# Patient Record
Sex: Female | Born: 1955 | Race: White | Hispanic: No | Marital: Married | Smoking: Never smoker
Health system: Southern US, Community
[De-identification: ages and names within clinical notes are randomized; demographics above are authoritative.]

## PROBLEM LIST (undated history)

## (undated) DIAGNOSIS — C801 Malignant (primary) neoplasm, unspecified: Secondary | ICD-10-CM

## (undated) DIAGNOSIS — G609 Hereditary and idiopathic neuropathy, unspecified: Secondary | ICD-10-CM

## (undated) DIAGNOSIS — K219 Gastro-esophageal reflux disease without esophagitis: Secondary | ICD-10-CM

## (undated) DIAGNOSIS — E079 Disorder of thyroid, unspecified: Secondary | ICD-10-CM

## (undated) HISTORY — DX: Gastro-esophageal reflux disease without esophagitis: K21.9

## (undated) HISTORY — PX: OTHER SURGICAL HISTORY: SHX169

## (undated) HISTORY — DX: Disorder of thyroid, unspecified: E07.9

## (undated) HISTORY — PX: LASIK: SHX215

## (undated) HISTORY — DX: Hereditary and idiopathic neuropathy, unspecified: G60.9

## (undated) HISTORY — PX: CHOLECYSTECTOMY: SHX55

## (undated) HISTORY — PX: HERNIA REPAIR: SHX51

## (undated) HISTORY — PX: ELBOW SURGERY: SHX618

## (undated) HISTORY — PX: ABDOMINAL HYSTERECTOMY: SHX81

## (undated) HISTORY — PX: PLANTAR FASCIA SURGERY: SHX746

---

## 2005-05-10 ENCOUNTER — Other Ambulatory Visit: Payer: Self-pay

## 2005-05-10 ENCOUNTER — Inpatient Hospital Stay: Payer: Self-pay | Admitting: Internal Medicine

## 2005-07-11 ENCOUNTER — Ambulatory Visit: Payer: Self-pay

## 2005-07-13 ENCOUNTER — Ambulatory Visit: Payer: Self-pay

## 2006-01-23 ENCOUNTER — Ambulatory Visit: Payer: Self-pay

## 2006-05-07 ENCOUNTER — Ambulatory Visit: Payer: Self-pay | Admitting: Gastroenterology

## 2006-07-16 ENCOUNTER — Ambulatory Visit: Payer: Self-pay

## 2007-03-12 ENCOUNTER — Encounter: Payer: Self-pay | Admitting: General Practice

## 2007-03-31 ENCOUNTER — Encounter: Payer: Self-pay | Admitting: General Practice

## 2007-04-30 ENCOUNTER — Encounter: Payer: Self-pay | Admitting: General Practice

## 2007-07-08 ENCOUNTER — Ambulatory Visit: Payer: Self-pay | Admitting: Family Medicine

## 2007-07-17 ENCOUNTER — Ambulatory Visit: Payer: Self-pay | Admitting: Obstetrics and Gynecology

## 2007-10-26 ENCOUNTER — Ambulatory Visit: Payer: Self-pay | Admitting: Family Medicine

## 2008-07-05 ENCOUNTER — Ambulatory Visit: Payer: Self-pay | Admitting: General Surgery

## 2008-07-08 ENCOUNTER — Ambulatory Visit: Payer: Self-pay | Admitting: General Surgery

## 2008-07-20 ENCOUNTER — Ambulatory Visit: Payer: Self-pay | Admitting: General Surgery

## 2008-07-20 ENCOUNTER — Ambulatory Visit: Payer: Self-pay | Admitting: Obstetrics and Gynecology

## 2008-07-28 ENCOUNTER — Ambulatory Visit: Payer: Self-pay | Admitting: General Surgery

## 2008-09-29 ENCOUNTER — Ambulatory Visit: Payer: Self-pay | Admitting: Podiatry

## 2009-03-24 ENCOUNTER — Ambulatory Visit: Payer: Self-pay | Admitting: General Practice

## 2009-05-16 ENCOUNTER — Ambulatory Visit: Payer: Self-pay | Admitting: Neurology

## 2009-07-18 ENCOUNTER — Encounter: Admission: RE | Admit: 2009-07-18 | Discharge: 2009-07-18 | Payer: Self-pay | Admitting: Neurosurgery

## 2009-07-27 ENCOUNTER — Ambulatory Visit: Payer: Self-pay | Admitting: Unknown Physician Specialty

## 2009-10-27 ENCOUNTER — Encounter: Admission: RE | Admit: 2009-10-27 | Discharge: 2009-10-27 | Payer: Self-pay | Admitting: Neurosurgery

## 2010-08-09 ENCOUNTER — Ambulatory Visit: Payer: Self-pay | Admitting: Unknown Physician Specialty

## 2010-08-20 ENCOUNTER — Encounter: Payer: Self-pay | Admitting: Neurosurgery

## 2011-02-05 ENCOUNTER — Ambulatory Visit: Payer: Self-pay | Admitting: General Surgery

## 2011-08-30 ENCOUNTER — Ambulatory Visit: Payer: Self-pay | Admitting: Obstetrics and Gynecology

## 2012-09-02 ENCOUNTER — Ambulatory Visit: Payer: Self-pay | Admitting: Family Medicine

## 2012-11-07 ENCOUNTER — Ambulatory Visit: Payer: Self-pay | Admitting: Unknown Physician Specialty

## 2013-12-03 DIAGNOSIS — E039 Hypothyroidism, unspecified: Secondary | ICD-10-CM | POA: Insufficient documentation

## 2013-12-03 DIAGNOSIS — E559 Vitamin D deficiency, unspecified: Secondary | ICD-10-CM | POA: Insufficient documentation

## 2014-03-04 DIAGNOSIS — E781 Pure hyperglyceridemia: Secondary | ICD-10-CM | POA: Insufficient documentation

## 2014-03-25 ENCOUNTER — Ambulatory Visit: Payer: Self-pay | Admitting: Obstetrics and Gynecology

## 2014-04-29 ENCOUNTER — Ambulatory Visit: Payer: Self-pay | Admitting: Unknown Physician Specialty

## 2014-05-12 ENCOUNTER — Ambulatory Visit: Payer: Self-pay | Admitting: Unknown Physician Specialty

## 2014-05-17 LAB — PATHOLOGY REPORT

## 2014-06-16 ENCOUNTER — Encounter: Payer: Self-pay | Admitting: Unknown Physician Specialty

## 2014-06-29 ENCOUNTER — Encounter: Payer: Self-pay | Admitting: Unknown Physician Specialty

## 2014-11-20 NOTE — Op Note (Signed)
PATIENT NAME:  Rachel Noble, HEMING MR#:  [id number] DATE OF BIRTH:  1956/03/15  DATE OF PROCEDURE:  05/12/2014  PREOPERATIVE DIAGNOSIS: Chronic lateral epicondylitis both elbows, right worse than left.   OPERATION: Nirschl procedure right elbow plus injection of left lateral epicondyle with steroid anesthetic.   SURGEON: Kathrene Alu., MD   ANESTHESIA: General.   HISTORY OF PRESENT ILLNESS: The patient had a long history of bilateral lateral epicondylitis of her elbows. I had injected both lateral epicondyles with steroid and anesthetic in the past. She, however, was still having some moderate residual discomfort referable to her right elbow.   The patient was brought in today for a Nirschl procedure on her right elbow and re- injection of her left lateral epicondyle with steroid and anesthetic.   DESCRIPTION OF PROCEDURE: The patient was taken to the Operating Room where satisfactory general anesthesia was achieved. A tourniquet was applied on her right upper arm and the right upper extremity was prepped and draped in the usual fashion for a procedure about the elbow. The right upper extremity was exsanguinated and the tourniquet was inflated. About an inch and a half longitudinal incision was made over the lateral epicondyle. The dissection was carried down through the subcutaneous tissue onto the common extensor attachment to the lateral epicondyle. The interval between the extensor  carpi radialis longus and the common extensor was identified. It was opened and then I dissected down to the lateral epicondyle itself and the insertion of the extensor carpi radialis brevis onto the lateral epicondyle. I went ahead and excised the scarred extensor carpi radialis brevis attachment in a V-shaped fashion. The radiohumeral joint was entered. There was a moderate amount of synovial fluid emanating from this joint.   I inspected the joint. No lesions were noted. I irrigated it with GU irrigant. I  then used a rongeur to debride the lateral epicondyle, and this was followed by the use of a small round bur to lightly decorticate the lateral epicondyle. I then drilled multiple small drill holes in the lateral epicondyle. I then closed the interval between the extensor carpi radialis longus and the common extensor with 2-0 Vicryl. The tourniquet was released. It was up 27 minutes. There was minimal bleeding. I went ahead and closed the subcutaneous with 3-0 Vicryl and the skin with skin staples. I injected the wound with about 15 mL of 0.5% Marcaine with epinephrine. Sterile dressing was applied followed by a fiberglass posterior splint with the elbow flexed 90 degrees. Wrist was incorporated into the posterior splint.   I then prepped the left elbow lateral epicondyle and injected it with several milliliters of a mixture of 0.5% Marcaine with epinephrine and Kenalog 40. Band-Aid was applied.   The patient was awakened and transferred to a stretcher bed. She was taken to the recovery room in satisfactory condition. Blood loss was negligible.    ____________________________ Kathrene Alu., MD hbk:TT D: 05/12/2014 37:62:83 ET T: 05/12/2014 17:09:51 ET JOB#: [id number]  cc: Kathrene Alu., MD, <Dictator> Vilinda Flake, Brooke Bonito MD ELECTRONICALLY SIGNED 05/23/2014 15:21

## 2015-04-21 ENCOUNTER — Other Ambulatory Visit: Payer: Self-pay | Admitting: Obstetrics and Gynecology

## 2015-04-21 DIAGNOSIS — Z1231 Encounter for screening mammogram for malignant neoplasm of breast: Secondary | ICD-10-CM

## 2015-04-28 ENCOUNTER — Ambulatory Visit
Admission: RE | Admit: 2015-04-28 | Discharge: 2015-04-28 | Disposition: A | Discharge status: Home / Self Care | Payer: 59 | Source: Ambulatory Visit | Attending: Obstetrics and Gynecology | Admitting: Obstetrics and Gynecology

## 2015-04-28 DIAGNOSIS — Z1231 Encounter for screening mammogram for malignant neoplasm of breast: Secondary | ICD-10-CM | POA: Insufficient documentation

## 2015-04-28 HISTORY — DX: Malignant (primary) neoplasm, unspecified: C80.1

## 2015-07-18 ENCOUNTER — Other Ambulatory Visit: Payer: Self-pay | Admitting: Emergency Medicine

## 2015-07-18 DIAGNOSIS — K219 Gastro-esophageal reflux disease without esophagitis: Secondary | ICD-10-CM

## 2015-07-18 MED ORDER — OMEPRAZOLE 40 MG PO CPDR: 40.0000 mg | DELAYED_RELEASE_CAPSULE | Freq: Once | ORAL | Status: DC

## 2015-07-18 NOTE — Telephone Encounter (Signed)
Received a faxed medication request from [facility] in [place].  Please advise.  Thank you.

## 2015-07-18 NOTE — Telephone Encounter (Signed)
Med refill approved 

## 2015-07-21 ENCOUNTER — Ambulatory Visit: Payer: Self-pay | Admitting: Physician Assistant

## 2015-10-14 ENCOUNTER — Other Ambulatory Visit: Payer: Self-pay

## 2015-10-14 DIAGNOSIS — Z299 Encounter for prophylactic measures, unspecified: Secondary | ICD-10-CM

## 2015-10-14 NOTE — Progress Notes (Signed)
Patient came in to have blood drawn per Dr. Sammuel Hines orders.  Blood was drawn from the right arm without any incident. Lab results are to sent to Dr. Eddie Dibbles with they are finalized.

## 2015-10-15 LAB — LIPID PANEL
CHOLESTEROL TOTAL: 174 mg/dL (ref 100–199)
Chol/HDL Ratio: 3.6 ratio units (ref 0.0–4.4)
HDL: 49 mg/dL (ref >39)
LDL Calculated: 84 mg/dL (ref 0–99)
TRIGLYCERIDES: 206 mg/dL — AB (ref 0–149)
VLDL CHOLESTEROL CAL: 41 mg/dL — AB (ref 5–40)

## 2015-10-15 LAB — HGB A1C W/O EAG: Hgb A1c MFr Bld: 5.5 % (ref 4.8–5.6)

## 2015-10-15 LAB — VITAMIN B12: Vitamin B-12: 912 pg/mL (ref 211–946)

## 2015-10-15 LAB — VITAMIN D 25 HYDROXY (VIT D DEFICIENCY, FRACTURES): VIT D 25 HYDROXY: 26.9 ng/mL — AB (ref 30.0–100.0)

## 2015-10-15 LAB — TSH: TSH: 3.01 u[IU]/mL (ref 0.450–4.500)

## 2015-10-19 NOTE — Progress Notes (Signed)
Lab results were faxed to Dr. Blossom Hoops at [facility] per patient's request.

## 2015-11-16 ENCOUNTER — Other Ambulatory Visit: Payer: Self-pay | Admitting: Emergency Medicine

## 2015-11-16 MED ORDER — OMEPRAZOLE 40 MG PO CPDR: 40.0000 mg | DELAYED_RELEASE_CAPSULE | Freq: Once | ORAL | Status: DC

## 2015-11-16 NOTE — Telephone Encounter (Signed)
Refill x 1 only, pt has not been seen in clinic since prior to July 2016, will not issue another refill until pt has been seen in clinic, or her pcp can refill meds if needed

## 2015-11-16 NOTE — Telephone Encounter (Signed)
Received a faxed medication request from [facility].  Please advise.  Thank you.

## 2015-12-16 ENCOUNTER — Telehealth: Payer: Self-pay | Admitting: Physician Assistant

## 2015-12-16 NOTE — Telephone Encounter (Signed)
Pt needs appointment, hasn't been seen in the clinic in one year, or she can have her pcp refill this medication

## 2016-01-16 ENCOUNTER — Other Ambulatory Visit: Payer: Self-pay | Admitting: Emergency Medicine

## 2016-02-15 ENCOUNTER — Other Ambulatory Visit: Payer: Self-pay | Admitting: Emergency Medicine

## 2016-02-15 MED ORDER — OMEPRAZOLE 40 MG PO CPDR: 40.0000 mg | DELAYED_RELEASE_CAPSULE | Freq: Once | ORAL | Status: DC

## 2016-03-14 ENCOUNTER — Ambulatory Visit: Payer: Self-pay | Admitting: Physician Assistant

## 2016-03-14 ENCOUNTER — Encounter: Payer: Self-pay | Admitting: Physician Assistant

## 2016-03-14 VITALS — BP 120/80 | HR 85 | Temp 98.3°F

## 2016-03-14 DIAGNOSIS — J069 Acute upper respiratory infection, unspecified: Secondary | ICD-10-CM

## 2016-03-14 DIAGNOSIS — J018 Other acute sinusitis: Secondary | ICD-10-CM

## 2016-03-14 MED ORDER — AMOXICILLIN 875 MG PO TABS: 875.0000 mg | ORAL_TABLET | Freq: Two times a day (BID) | ORAL | 0 refills | Status: DC

## 2016-03-14 MED ORDER — BENZONATATE 200 MG PO CAPS: 200.0000 mg | ORAL_CAPSULE | Freq: Three times a day (TID) | ORAL | 0 refills | Status: DC | PRN

## 2016-03-14 NOTE — Progress Notes (Signed)
S: C/o runny nose and congestion for 5-6 days, had fever last night,  denies cp/sob, v/d;  cough is sporadic, c/o of facial and dental pain. Used her husbands tussionex for cough and had a lot of itching  Using otc meds: zyrtec  O: PE: perrl eomi, normocephalic, tms dull, nasal mucosa red and swollen, throat injected, neck supple no lymph, lungs c t a, cv rrr, neuro intact  A:  Acute sinusitis   P: drink fluids, continue regular meds , use otc meds of choice, return if not improving in 5 days, return earlier if worsening , amoxil, tessalon, do not use tussionex due to  allergic reaction

## 2016-03-19 ENCOUNTER — Encounter: Payer: Self-pay | Admitting: Physician Assistant

## 2016-03-19 ENCOUNTER — Ambulatory Visit: Payer: Self-pay | Admitting: Physician Assistant

## 2016-03-19 VITALS — BP 151/81 | HR 101 | Temp 97.9°F

## 2016-03-19 DIAGNOSIS — J069 Acute upper respiratory infection, unspecified: Secondary | ICD-10-CM

## 2016-03-19 MED ORDER — METHYLPREDNISOLONE 4 MG PO TBPK: ORAL_TABLET | ORAL | 0 refills | Status: DC

## 2016-03-19 NOTE — Progress Notes (Signed)
S: continues to feel bad, cough isn't as bad, not sure if its because of the tessalon perls, states has not had a fever, is not getting any mucus out, feels like its her throat and sinuses, is taking amoxil but feels more tired than she did last week  O: vitals wnl, nad, ENT wnl, neck supple no lymph, lungs c t a, cv rrr, voice is hoarse  A: acute uri  P: medrol dose pack, allegra, continue amoxil, if not better in 2-3 days will switch antibiotic

## 2016-05-11 ENCOUNTER — Other Ambulatory Visit: Payer: Self-pay

## 2016-05-11 DIAGNOSIS — E039 Hypothyroidism, unspecified: Secondary | ICD-10-CM

## 2016-05-11 NOTE — Progress Notes (Signed)
Patient in office today for labwk only. Order on file from [facility] Dr. Lillia Pauls

## 2016-05-12 LAB — SPECIMEN STATUS

## 2016-05-15 LAB — TSH: TSH: 2.38 u[IU]/mL (ref 0.450–4.500)

## 2016-05-15 LAB — THYROID PEROXIDASE ANTIBODY: THYROID PEROXIDASE ANTIBODY: 19 [IU]/mL (ref 0–34)

## 2016-07-06 ENCOUNTER — Other Ambulatory Visit: Payer: Self-pay

## 2016-07-06 DIAGNOSIS — Z299 Encounter for prophylactic measures, unspecified: Secondary | ICD-10-CM

## 2016-07-06 NOTE — Progress Notes (Signed)
Patient came in to have blood drawn for testing per Dr. Adella Hare and Glenis Smoker CNM orders.

## 2016-07-07 LAB — CMP12+LP+TP+TSH+6AC+CBC/D/PLT
A/G RATIO: 1.8 (ref 1.2–2.2)
ALT: 11 IU/L (ref 0–32)
AST: 12 IU/L (ref 0–40)
Albumin: 4.5 g/dL (ref 3.5–5.5)
Alkaline Phosphatase: 102 IU/L (ref 39–117)
BASOS: 1 %
BUN / CREAT RATIO: 15 (ref 9–23)
BUN: 11 mg/dL (ref 6–24)
Basophils Absolute: 0.1 10*3/uL (ref 0.0–0.2)
Bilirubin Total: 0.3 mg/dL (ref 0.0–1.2)
CALCIUM: 9.6 mg/dL (ref 8.7–10.2)
CHOL/HDL RATIO: 3.8 ratio (ref 0.0–4.4)
CREATININE: 0.72 mg/dL (ref 0.57–1.00)
Chloride: 99 mmol/L (ref 96–106)
Cholesterol, Total: 202 mg/dL — ABNORMAL HIGH (ref 100–199)
EOS (ABSOLUTE): 0.1 10*3/uL (ref 0.0–0.4)
EOS: 1 %
Estimated CHD Risk: 0.8 times avg. (ref 0.0–1.0)
Free Thyroxine Index: 0.9 — ABNORMAL LOW (ref 1.2–4.9)
GFR, EST AFRICAN AMERICAN: 107 mL/min/{1.73_m2} (ref >59)
GFR, EST NON AFRICAN AMERICAN: 93 mL/min/{1.73_m2} (ref >59)
GGT: 14 IU/L (ref 0–60)
GLUCOSE: 93 mg/dL (ref 65–99)
Globulin, Total: 2.5 g/dL (ref 1.5–4.5)
HDL: 53 mg/dL (ref >39)
HEMATOCRIT: 41.6 % (ref 34.0–46.6)
HEMOGLOBIN: 13.8 g/dL (ref 11.1–15.9)
IMMATURE GRANS (ABS): 0 10*3/uL (ref 0.0–0.1)
Immature Granulocytes: 0 %
Iron: 118 ug/dL (ref 27–159)
LDH: 137 IU/L (ref 119–226)
LDL CALC: 99 mg/dL (ref 0–99)
Lymphocytes Absolute: 4.1 10*3/uL — ABNORMAL HIGH (ref 0.7–3.1)
Lymphs: 41 %
MCH: 29.1 pg (ref 26.6–33.0)
MCHC: 33.2 g/dL (ref 31.5–35.7)
MCV: 88 fL (ref 79–97)
MONOCYTES: 5 %
Monocytes Absolute: 0.5 10*3/uL (ref 0.1–0.9)
NEUTROS ABS: 5.3 10*3/uL (ref 1.4–7.0)
Neutrophils: 52 %
PHOSPHORUS: 3.1 mg/dL (ref 2.5–4.5)
POTASSIUM: 4.5 mmol/L (ref 3.5–5.2)
Platelets: 369 10*3/uL (ref 150–379)
RBC: 4.74 x10E6/uL (ref 3.77–5.28)
RDW: 13.1 % (ref 12.3–15.4)
SODIUM: 141 mmol/L (ref 134–144)
T3 Uptake Ratio: 15 % — ABNORMAL LOW (ref 24–39)
T4 TOTAL: 6.2 ug/dL (ref 4.5–12.0)
TSH: 3.27 u[IU]/mL (ref 0.450–4.500)
Total Protein: 7 g/dL (ref 6.0–8.5)
Triglycerides: 250 mg/dL — ABNORMAL HIGH (ref 0–149)
URIC ACID: 5 mg/dL (ref 2.5–7.1)
VLDL Cholesterol Cal: 50 mg/dL — ABNORMAL HIGH (ref 5–40)
WBC: 10.1 10*3/uL (ref 3.4–10.8)

## 2016-07-07 LAB — HGB A1C W/O EAG: Hgb A1c MFr Bld: 5.4 % (ref 4.8–5.6)

## 2016-07-07 LAB — B12 AND FOLATE PANEL
Folate: 4.8 ng/mL (ref >3.0)
VITAMIN B 12: 861 pg/mL (ref 232–1245)

## 2016-07-07 LAB — VITAMIN D 25 HYDROXY (VIT D DEFICIENCY, FRACTURES): Vit D, 25-Hydroxy: 21.6 ng/mL — ABNORMAL LOW (ref 30.0–100.0)

## 2016-07-07 LAB — T4, FREE: FREE T4: 0.74 ng/dL — AB (ref 0.82–1.77)

## 2016-08-14 ENCOUNTER — Other Ambulatory Visit: Payer: Self-pay | Admitting: Obstetrics and Gynecology

## 2016-08-14 DIAGNOSIS — Z1231 Encounter for screening mammogram for malignant neoplasm of breast: Secondary | ICD-10-CM

## 2016-08-20 ENCOUNTER — Ambulatory Visit: Payer: Self-pay | Admitting: Physician Assistant

## 2016-08-20 VITALS — BP 125/79 | HR 90 | Temp 98.5°F

## 2016-08-20 DIAGNOSIS — J069 Acute upper respiratory infection, unspecified: Secondary | ICD-10-CM

## 2016-08-20 DIAGNOSIS — J09X2 Influenza due to identified novel influenza A virus with other respiratory manifestations: Secondary | ICD-10-CM

## 2016-08-20 LAB — POCT INFLUENZA A/B
INFLUENZA B, POC: NEGATIVE
Influenza A, POC: POSITIVE — AB

## 2016-08-20 NOTE — Progress Notes (Signed)
See note scanned per NP .

## 2016-08-21 LAB — POCT RAPID STREP A (OFFICE): Rapid Strep A Screen: NEGATIVE

## 2016-08-22 ENCOUNTER — Ambulatory Visit: Payer: Self-pay | Admitting: Physician Assistant

## 2016-08-22 ENCOUNTER — Encounter: Payer: Self-pay | Admitting: Physician Assistant

## 2016-08-22 VITALS — BP 140/80 | HR 110 | Temp 98.5°F

## 2016-08-22 DIAGNOSIS — J101 Influenza due to other identified influenza virus with other respiratory manifestations: Secondary | ICD-10-CM

## 2016-08-22 DIAGNOSIS — I10 Essential (primary) hypertension: Secondary | ICD-10-CM | POA: Insufficient documentation

## 2016-08-22 NOTE — Progress Notes (Signed)
S: here for recheck, was dx with influenza A on Monday, given tamiflu, did get a flu vaccine this year, states she hasn't had temp or body aches, cough is controlled with nyquil and is taking the tamiflu, ?if her husband should be on preventive medication, denies cp/sob/v/d  O: vitals wnl, nad, ent wnl, neck supple no lymph, lungs c ta , cv rrr  A: influenza A recheck  P: continue tamflu, sent rx for her husband Rachel Noble (also a pt here) for tamiflu qd x 10d; return prn

## 2016-09-13 ENCOUNTER — Ambulatory Visit
Admission: RE | Admit: 2016-09-13 | Discharge: 2016-09-13 | Disposition: A | Discharge status: Home / Self Care | Payer: Managed Care, Other (non HMO) | Source: Ambulatory Visit | Attending: Obstetrics and Gynecology | Admitting: Obstetrics and Gynecology

## 2016-09-13 DIAGNOSIS — Z1231 Encounter for screening mammogram for malignant neoplasm of breast: Secondary | ICD-10-CM | POA: Insufficient documentation

## 2016-09-18 ENCOUNTER — Ambulatory Visit (INDEPENDENT_AMBULATORY_CARE_PROVIDER_SITE_OTHER): Payer: Managed Care, Other (non HMO) | Admitting: Family Medicine

## 2016-09-18 ENCOUNTER — Encounter: Payer: Self-pay | Admitting: Family Medicine

## 2016-09-18 VITALS — BP 128/74 | HR 64 | Temp 97.9°F | Resp 16 | Ht 64.0 in | Wt 170.0 lb

## 2016-09-18 DIAGNOSIS — E039 Hypothyroidism, unspecified: Secondary | ICD-10-CM | POA: Diagnosis not present

## 2016-09-18 DIAGNOSIS — E559 Vitamin D deficiency, unspecified: Secondary | ICD-10-CM | POA: Diagnosis not present

## 2016-09-18 NOTE — Progress Notes (Signed)
Patient: Rachel Noble Female    DOB: May 25, 1956   61 y.o.   MRN: [id number] Visit Date: 09/18/2016  Today's Provider: Wilhemena Durie, MD   Chief Complaint  Patient presents with  . Establish Care   Subjective:    HPI   Establish Care Pt is here to establish care. Pt's previous PCP retired. Pt sees Dr. Jonnie Kind for [facility], and sees dr. Eddie Dibbles for endocrinology. Pt feels well today with no complaints.  she is married (second marriage), now 41 years. She has 2 children and 2 grandchildren. She was previously seeing Dr. Gaylan Gerold who retired.  Allergies  Allergen Reactions  . Tussionex Pennkinetic Er [Hydrocod Polst-Cpm Polst Er] Itching     Current Outpatient Prescriptions:  .  cetirizine (ZYRTEC) 10 MG tablet, Take 10 mg by mouth daily., Disp: , Rfl:  .  Cholecalciferol (VITAMIN D-1000 MAX ST) 1000 UNITS tablet, Take by mouth., Disp: , Rfl:  .  cyanocobalamin (,VITAMIN B-12,) 1000 MCG/ML injection, Inject into the muscle., Disp: , Rfl:  .  DULoxetine (CYMBALTA) 20 MG capsule, Take by mouth., Disp: , Rfl:  .  estradiol (ESTRACE) 1 MG tablet, Take by mouth., Disp: , Rfl:  .  Krill Oil 1000 MG CAPS, Take by mouth., Disp: , Rfl:  .  omeprazole (PRILOSEC) 40 MG capsule, Take 1 capsule (40 mg total) by mouth once., Disp: 30 capsule, Rfl: 5 .  thyroid (ARMOUR THYROID) 15 MG tablet, Take by mouth., Disp: , Rfl:   Review of Systems  Constitutional: Negative.   HENT: Negative.   Eyes: Negative.   Respiratory: Negative.   Cardiovascular: Negative.   Gastrointestinal: Negative.   Endocrine: Negative.   Genitourinary: Negative.   Musculoskeletal: Negative.   Skin: Negative.   Allergic/Immunologic: Negative.   Neurological: Negative.   Hematological: Negative.   Psychiatric/Behavioral: Negative.     Social History  Substance Use Topics  . Smoking status: Never Smoker  . Smokeless tobacco: Never Used  . Alcohol use No   Objective:   BP 128/74 (BP Location: Left  Arm, Patient Position: Sitting, Cuff Size: Normal)   Pulse 64   Temp 97.9 F (36.6 C) (Oral)   Resp 16   Ht 5\' 4"  (1.626 m)   Wt 170 lb (77.1 kg)   BMI 29.18 kg/m   Physical Exam  Constitutional: She is oriented to person, place, and time. She appears well-developed and well-nourished.  HENT:  Head: Normocephalic and atraumatic.  Right Ear: External ear normal.  Left Ear: External ear normal.  Nose: Nose normal.  Eyes: Conjunctivae are normal. No scleral icterus.  Neck: No thyromegaly present.  Cardiovascular: Normal rate, regular rhythm and normal heart sounds.   Pulmonary/Chest: Effort normal and breath sounds normal.  Abdominal: Soft.  Neurological: She is alert and oriented to person, place, and time.  Skin: Skin is warm and dry.  Psychiatric: She has a normal mood and affect. Her behavior is normal. Judgment and thought content normal.        Assessment & Plan:       1. Hypothyroid  2.  GERD  3. Allergic rhinitis  4. Neuropathy of the feet  5. Vitamin D deficiency   RTC 6 months for CPE. Patient seen and examined by Miguel Aschoff, MD, and note scribed by Renaldo Fiddler, CMA. I have done the exam and reviewed the above chart and it is accurate to the best of my knowledge. Development worker, community has been used in  this note in any air is in the dictation or transcription are unintentional.  Wilhemena Durie, MD  [facility]

## 2016-11-07 ENCOUNTER — Other Ambulatory Visit: Payer: Self-pay

## 2016-11-12 ENCOUNTER — Other Ambulatory Visit: Payer: Self-pay

## 2016-11-12 DIAGNOSIS — Z299 Encounter for prophylactic measures, unspecified: Secondary | ICD-10-CM

## 2016-11-12 NOTE — Progress Notes (Signed)
Patient came in to have blood drawn for testing per Dr. Sammuel Hines orders. Patient wants results faxed to Dr. Eddie Dibbles and a copy faxed to her attention when they are finalized.

## 2016-11-13 LAB — CMP12+LP+TP+TSH+6AC+CBC/D/PLT
A/G RATIO: 1.6 (ref 1.2–2.2)
ALT: 13 IU/L (ref 0–32)
AST: 18 IU/L (ref 0–40)
Albumin: 4.4 g/dL (ref 3.5–5.5)
Alkaline Phosphatase: 99 IU/L (ref 39–117)
BASOS ABS: 0.1 10*3/uL (ref 0.0–0.2)
BUN / CREAT RATIO: 20 (ref 9–23)
BUN: 16 mg/dL (ref 6–24)
Basos: 1 %
Bilirubin Total: <0.2 mg/dL (ref 0.0–1.2)
CALCIUM: 10.2 mg/dL (ref 8.7–10.2)
CREATININE: 0.82 mg/dL (ref 0.57–1.00)
Chloride: 102 mmol/L (ref 96–106)
Chol/HDL Ratio: 3.3 ratio (ref 0.0–4.4)
Cholesterol, Total: 190 mg/dL (ref 100–199)
EOS (ABSOLUTE): 0.1 10*3/uL (ref 0.0–0.4)
Eos: 1 %
Free Thyroxine Index: 0.7 — ABNORMAL LOW (ref 1.2–4.9)
GFR calc Af Amer: 91 mL/min/{1.73_m2} (ref >59)
GFR, EST NON AFRICAN AMERICAN: 79 mL/min/{1.73_m2} (ref >59)
GGT: 12 IU/L (ref 0–60)
GLUCOSE: 95 mg/dL (ref 65–99)
Globulin, Total: 2.8 g/dL (ref 1.5–4.5)
HDL: 58 mg/dL (ref >39)
Hematocrit: 41.4 % (ref 34.0–46.6)
Hemoglobin: 13.8 g/dL (ref 11.1–15.9)
IRON: 77 ug/dL (ref 27–159)
Immature Grans (Abs): 0 10*3/uL (ref 0.0–0.1)
Immature Granulocytes: 0 %
LDH: 143 IU/L (ref 119–226)
LDL Calculated: 92 mg/dL (ref 0–99)
LYMPHS ABS: 4.9 10*3/uL — AB (ref 0.7–3.1)
Lymphs: 39 %
MCH: 29.6 pg (ref 26.6–33.0)
MCHC: 33.3 g/dL (ref 31.5–35.7)
MCV: 89 fL (ref 79–97)
Monocytes Absolute: 0.6 10*3/uL (ref 0.1–0.9)
Monocytes: 5 %
NEUTROS ABS: 6.9 10*3/uL (ref 1.4–7.0)
Neutrophils: 54 %
PHOSPHORUS: 3.4 mg/dL (ref 2.5–4.5)
PLATELETS: 361 10*3/uL (ref 150–379)
POTASSIUM: 4.6 mmol/L (ref 3.5–5.2)
RBC: 4.67 x10E6/uL (ref 3.77–5.28)
RDW: 13.5 % (ref 12.3–15.4)
Sodium: 144 mmol/L (ref 134–144)
T3 UPTAKE RATIO: 12 % — AB (ref 24–39)
T4 TOTAL: 6.2 ug/dL (ref 4.5–12.0)
TOTAL PROTEIN: 7.2 g/dL (ref 6.0–8.5)
TSH: 3.45 u[IU]/mL (ref 0.450–4.500)
Triglycerides: 201 mg/dL — ABNORMAL HIGH (ref 0–149)
URIC ACID: 5.3 mg/dL (ref 2.5–7.1)
VLDL Cholesterol Cal: 40 mg/dL (ref 5–40)
WBC: 12.6 10*3/uL — ABNORMAL HIGH (ref 3.4–10.8)

## 2016-11-13 LAB — HGB A1C W/O EAG: Hgb A1c MFr Bld: 5.3 % (ref 4.8–5.6)

## 2016-11-13 LAB — T4, FREE: FREE T4: 0.76 ng/dL — AB (ref 0.82–1.77)

## 2016-11-13 LAB — VITAMIN D 25 HYDROXY (VIT D DEFICIENCY, FRACTURES): VIT D 25 HYDROXY: 31.5 ng/mL (ref 30.0–100.0)

## 2017-02-11 ENCOUNTER — Other Ambulatory Visit: Payer: Self-pay

## 2017-02-11 DIAGNOSIS — Z299 Encounter for prophylactic measures, unspecified: Secondary | ICD-10-CM

## 2017-02-11 NOTE — Progress Notes (Signed)
Patient came in to have blood drawn for testing per dr. Sammuel Hines authorization.

## 2017-02-13 LAB — TSH: TSH: 2.59 u[IU]/mL (ref 0.450–4.500)

## 2017-02-13 LAB — VITAMIN D 25 HYDROXY (VIT D DEFICIENCY, FRACTURES): VIT D 25 HYDROXY: 20.6 ng/mL — AB (ref 30.0–100.0)

## 2017-02-13 LAB — VITAMIN B12: Vitamin B-12: 852 pg/mL (ref 232–1245)

## 2017-07-03 ENCOUNTER — Other Ambulatory Visit: Payer: Self-pay | Admitting: Obstetrics and Gynecology

## 2017-07-03 DIAGNOSIS — Z1231 Encounter for screening mammogram for malignant neoplasm of breast: Secondary | ICD-10-CM

## 2017-08-16 ENCOUNTER — Other Ambulatory Visit: Payer: Self-pay

## 2017-08-16 DIAGNOSIS — Z299 Encounter for prophylactic measures, unspecified: Secondary | ICD-10-CM

## 2017-08-16 NOTE — Progress Notes (Signed)
Patient came in to have blood drawn for testing per Dr. Bhakti Paul's orders. 

## 2017-08-17 LAB — SPECIMEN STATUS

## 2017-08-20 NOTE — Progress Notes (Signed)
No CBC yet can y ou check on it?? Thank you.

## 2017-08-23 LAB — B12 AND FOLATE PANEL
FOLATE: 9.3 ng/mL (ref >3.0)
Vitamin B-12: 1003 pg/mL (ref 232–1245)

## 2017-08-23 LAB — VITAMIN D 25 HYDROXY (VIT D DEFICIENCY, FRACTURES): Vit D, 25-Hydroxy: 32 ng/mL (ref 30.0–100.0)

## 2017-08-23 LAB — TSH: TSH: 2.69 u[IU]/mL (ref 0.450–4.500)

## 2017-08-26 NOTE — Progress Notes (Signed)
Please add in a note that no  cbc was drawn. Thank you

## 2017-09-17 ENCOUNTER — Ambulatory Visit
Admission: RE | Admit: 2017-09-17 | Discharge: 2017-09-17 | Disposition: A | Discharge status: Home / Self Care | Payer: Managed Care, Other (non HMO) | Source: Ambulatory Visit | Attending: Obstetrics and Gynecology | Admitting: Obstetrics and Gynecology

## 2017-09-17 DIAGNOSIS — Z1231 Encounter for screening mammogram for malignant neoplasm of breast: Secondary | ICD-10-CM | POA: Diagnosis present

## 2018-02-13 ENCOUNTER — Telehealth: Payer: Self-pay | Admitting: Family Medicine

## 2018-02-13 NOTE — Telephone Encounter (Signed)
Patient returned call. Patient stated it is hard for her to answer calls. So if possible she would like a detailed message left on her vm [phone]

## 2018-02-13 NOTE — Telephone Encounter (Signed)
[facility] and she wants to know if Dr. Rosanna Randy will order for her to have all the tick borne lab test done.  She has been feeling really fatigued lately with no explanation of why...  She has thyroid problems but she just had this checked and is fine.  She said she just had a physical at [facility] with Glenis Smoker NP.    I told her that it is not common practice for Dr. Darnell Level  to do this but she insisted that I ask.  She thinks it s been a year since she seen Dr. Rosanna Randy  CB# [phone]  Leave a message if she does not answer.  Thanks [employer]

## 2018-02-13 NOTE — Telephone Encounter (Signed)
LMTCB ED 

## 2018-02-14 NOTE — Telephone Encounter (Signed)
Left message detailing that Dr Rosanna Randy would not order the tests, that he does not practice medicine that way, especially since it has been almost 1 1/2 years since she has been seen.  She was offered that if she wanted to pursue this she could call the office and make an appointment.

## 2018-03-04 DIAGNOSIS — G4482 Headache associated with sexual activity: Secondary | ICD-10-CM | POA: Insufficient documentation

## 2018-03-09 DIAGNOSIS — G5793 Unspecified mononeuropathy of bilateral lower limbs: Secondary | ICD-10-CM | POA: Insufficient documentation

## 2018-03-18 ENCOUNTER — Ambulatory Visit: Payer: Managed Care, Other (non HMO) | Admitting: Family Medicine

## 2018-03-18 ENCOUNTER — Other Ambulatory Visit: Payer: Self-pay

## 2018-03-18 VITALS — BP 130/80 | HR 98 | Temp 97.7°F | Resp 16 | Wt 166.0 lb

## 2018-03-18 DIAGNOSIS — G629 Polyneuropathy, unspecified: Secondary | ICD-10-CM

## 2018-03-18 DIAGNOSIS — F329 Major depressive disorder, single episode, unspecified: Secondary | ICD-10-CM

## 2018-03-18 DIAGNOSIS — R5383 Other fatigue: Secondary | ICD-10-CM | POA: Diagnosis not present

## 2018-03-18 DIAGNOSIS — F32A Depression, unspecified: Secondary | ICD-10-CM

## 2018-03-18 MED ORDER — BUPROPION HCL ER (XL) 150 MG PO TB24: 150.0000 mg | ORAL_TABLET | Freq: Every day | ORAL | 11 refills | Status: DC

## 2018-03-18 NOTE — Addendum Note (Signed)
Addended by: Carlene Coria on: 03/18/2018 03:28 PM   Modules accepted: Orders

## 2018-03-18 NOTE — Progress Notes (Signed)
Rachel Noble  MRN: [id number] DOB: Nov 03, 1955  Subjective:  HPI   The patient is a 62 year old female who presents with complaint of fatigue for 2 years.  She states she has been  Feeling bad during that time and it is of note that she has had 3 tick bites over that 3 years.  The last tick bite was 2 weeks ago.  She states that she had a tick bite at her umbilical 2 years ago and the dermatologist said he thought it was an age spot.  The patient states that she thought it looked like a wart.  She did not get any treatment.    The patient also has hyperthyroid and she said that her endocrinologist checked her labs 3 months ago and her level was good.   She has not been checked for any tick bourne fevers.      Depression screen PHQ 2/9 03/18/2018  Decreased Interest 1  Down, Depressed, Hopeless 2  PHQ - 2 Score 3  Altered sleeping 3  Tired, decreased energy 3  Change in appetite 2  Feeling bad or failure about yourself  1  Trouble concentrating 2  Moving slowly or fidgety/restless 0  Suicidal thoughts 0  PHQ-9 Score 14  Difficult doing work/chores Somewhat difficult    Patient Active Problem List   Diagnosis Date Noted  . Hypertension 08/22/2016  . Hypertriglyceridemia 03/04/2014  . Hypothyroidism 12/03/2013  . Vitamin D deficiency 12/03/2013    Past Medical History:  Diagnosis Date  . Cancer ([place])    skin    Social History   Socioeconomic History  . Marital status: Married    Spouse name: Not on file  . Number of children: Not on file  . Years of education: Not on file  . Highest education level: Not on file  Occupational History  . Not on file  Social Needs  . Financial resource strain: Not on file  . Food insecurity:    Worry: Not on file    Inability: Not on file  . Transportation needs:    Medical: Not on file    Non-medical: Not on file  Tobacco Use  . Smoking status: Never Smoker  . Smokeless tobacco: Never Used  Substance and Sexual Activity    . Alcohol use: No  . Drug use: No  . Sexual activity: Not on file  Lifestyle  . Physical activity:    Days per week: Not on file    Minutes per session: Not on file  . Stress: Not on file  Relationships  . Social connections:    Talks on phone: Not on file    Gets together: Not on file    Attends religious service: Not on file    Active member of club or organization: Not on file    Attends meetings of clubs or organizations: Not on file    Relationship status: Not on file  . Intimate partner violence:    Fear of current or ex partner: Not on file    Emotionally abused: Not on file    Physically abused: Not on file    Forced sexual activity: Not on file  Other Topics Concern  . Not on file  Social History Narrative  . Not on file    Outpatient Encounter Medications as of 03/18/2018  Medication Sig Note  . cetirizine (ZYRTEC) 10 MG tablet Take 10 mg by mouth daily.   . Cholecalciferol (VITAMIN D-1000 MAX ST) 1000 UNITS  tablet Take by mouth. 07/18/2015: Received from: [facility]  . cyanocobalamin (,VITAMIN B-12,) 1000 MCG/ML injection Inject into the muscle. 07/18/2015: Received from: [place]  . estradiol (ESTRACE) 1 MG tablet Take by mouth.   [name] 1000 MG CAPS Take by mouth.   . nortriptyline (PAMELOR) 10 MG capsule Take 20 mg by mouth at bedtime.   [name] omeprazole (PRILOSEC) 40 MG capsule Take 1 capsule (40 mg total) by mouth once.   . thyroid (ARMOUR THYROID) 15 MG tablet Take by mouth. 07/18/2015: Received from: [facility]  . DULoxetine (CYMBALTA) 20 MG capsule Take by mouth. 03/14/2016: Received from: [facility]: Take 1 capsule (20 mg total) by mouth once daily.   No facility-administered encounter medications on file as of 03/18/2018.     Allergies  Allergen Reactions  . Tussionex Pennkinetic Er [Hydrocod Polst-Cpm Polst Er] Itching    Review of Systems  Constitutional:  Positive for malaise/fatigue. Negative for fever.  Eyes: Negative.   Respiratory: Negative for cough, shortness of breath and wheezing.   Cardiovascular: Negative for chest pain, palpitations, orthopnea, claudication and leg swelling.  Gastrointestinal: Negative.   Neurological: Negative.   Endo/Heme/Allergies: Negative.   Psychiatric/Behavioral: Positive for depression.    Objective:  BP 130/80 (BP Location: Right Arm, Patient Position: Sitting, Cuff Size: Large)   Pulse 98   Temp 97.7 F (36.5 C) (Oral)   Resp 16   Wt 166 lb (75.3 kg)   BMI 28.49 kg/m   Physical Exam  Constitutional: She is oriented to person, place, and time and well-developed, well-nourished, and in no distress.  HENT:  Head: Normocephalic and atraumatic.  Eyes: Conjunctivae are normal. No scleral icterus.  Neck: No thyromegaly present.  Cardiovascular: Normal rate, regular rhythm and normal heart sounds.  Pulmonary/Chest: Effort normal and breath sounds normal.  Abdominal: Soft.  Musculoskeletal: She exhibits no edema.  Neurological: She is alert and oriented to person, place, and time. Gait normal. GCS score is 15.  Skin: Skin is warm and dry.  Psychiatric: Mood, memory, affect and judgment normal.    Assessment and Plan :  1. Other fatigue I do not think this could be [place]. - buPROPion (WELLBUTRIN XL) 150 MG 24 hr tablet; Take 1 tablet (150 mg total) by mouth daily.  Dispense: 30 tablet; Refill: 11  2. Depression, unspecified depression type May need psychiatry /counseling referral. - buPROPion (WELLBUTRIN XL) 150 MG 24 hr tablet; Take 1 tablet (150 mg total) by mouth daily.  Dispense: 30 tablet; Refill: 11  3. Neuropathy Check labs. Follow symptoms.  I have done the exam and reviewed the chart and it is accurate to the best of my knowledge. Development worker, community has been used and  any errors in dictation or transcription are unintentional. Miguel Aschoff M.D. [facility]

## 2018-03-19 LAB — CBC WITH DIFFERENTIAL/PLATELET
BASOS ABS: 0.1 10*3/uL (ref 0.0–0.2)
Basos: 1 %
EOS (ABSOLUTE): 0.1 10*3/uL (ref 0.0–0.4)
Eos: 1 %
Hematocrit: 39.5 % (ref 34.0–46.6)
Hemoglobin: 13.7 g/dL (ref 11.1–15.9)
Immature Grans (Abs): 0 10*3/uL (ref 0.0–0.1)
Immature Granulocytes: 0 %
LYMPHS ABS: 5 10*3/uL — AB (ref 0.7–3.1)
Lymphs: 37 %
MCH: 29.5 pg (ref 26.6–33.0)
MCHC: 34.7 g/dL (ref 31.5–35.7)
MCV: 85 fL (ref 79–97)
MONOS ABS: 0.7 10*3/uL (ref 0.1–0.9)
Monocytes: 5 %
NEUTROS ABS: 7.6 10*3/uL — AB (ref 1.4–7.0)
Neutrophils: 56 %
Platelets: 355 10*3/uL (ref 150–450)
RBC: 4.65 x10E6/uL (ref 3.77–5.28)
RDW: 12.9 % (ref 12.3–15.4)
WBC: 13.5 10*3/uL — AB (ref 3.4–10.8)

## 2018-03-19 LAB — TSH: TSH: 2.53 u[IU]/mL (ref 0.450–4.500)

## 2018-03-19 LAB — COMPREHENSIVE METABOLIC PANEL
ALK PHOS: 92 IU/L (ref 39–117)
ALT: 11 IU/L (ref 0–32)
AST: 16 IU/L (ref 0–40)
Albumin/Globulin Ratio: 1.7 (ref 1.2–2.2)
Albumin: 4.2 g/dL (ref 3.6–4.8)
BUN / CREAT RATIO: 13 (ref 12–28)
BUN: 10 mg/dL (ref 8–27)
CHLORIDE: 99 mmol/L (ref 96–106)
CO2: 24 mmol/L (ref 20–29)
CREATININE: 0.77 mg/dL (ref 0.57–1.00)
Calcium: 9.8 mg/dL (ref 8.7–10.3)
GFR calc Af Amer: 97 mL/min/{1.73_m2} (ref >59)
GFR calc non Af Amer: 84 mL/min/{1.73_m2} (ref >59)
GLOBULIN, TOTAL: 2.5 g/dL (ref 1.5–4.5)
Glucose: 116 mg/dL — ABNORMAL HIGH (ref 65–99)
Potassium: 4.6 mmol/L (ref 3.5–5.2)
Sodium: 139 mmol/L (ref 134–144)
Total Protein: 6.7 g/dL (ref 6.0–8.5)

## 2018-03-19 LAB — SPECIMEN STATUS REPORT

## 2018-03-19 LAB — B. BURGDORFI ANTIBODIES, CSF

## 2018-03-24 ENCOUNTER — Telehealth: Payer: Self-pay | Admitting: Family Medicine

## 2018-03-24 DIAGNOSIS — G629 Polyneuropathy, unspecified: Secondary | ICD-10-CM

## 2018-03-24 DIAGNOSIS — R5383 Other fatigue: Secondary | ICD-10-CM

## 2018-03-24 NOTE — Telephone Encounter (Signed)
Please review. Thanks!  

## 2018-03-24 NOTE — Telephone Encounter (Signed)
Pt was in last thursday and had some labs done.  She has not heard anything back yet  CB# [phone]  She said you can leave her a voice mail if she does not pick up  Thanks teri

## 2018-03-27 NOTE — Telephone Encounter (Signed)
Advised patient of results. Patient wanted to get this lab test done. Lab that was ordered on 8/20 for lyme disease was incorrect. A new order was placed, and patient will pick up lab slip and have this done.

## 2018-03-27 NOTE — Telephone Encounter (Signed)
Pt states she still has not heard from her lab results.  States she did get a letter in the mail and she didn't understand the results.  States one test was not done due to the specimen not being frozen.    Requesting a call back.

## 2018-03-27 NOTE — Telephone Encounter (Signed)
-----   Message from [name], MD sent at 03/25/2018  8:11 AM EDT ----- Rachel Noble not done due to where drawn. WBC mildly elevated--may need f/u to determine clinical next step.

## 2018-04-04 LAB — LYME DISEASE DNA BY PCR(BORRELIA BURG): LYME (B. BURGDORFERI) PCR: NEGATIVE

## 2018-04-07 ENCOUNTER — Telehealth: Payer: Self-pay

## 2018-04-07 NOTE — Telephone Encounter (Signed)
Pt advised.   Thanks,   -Javeon Macmurray  

## 2018-04-07 NOTE — Telephone Encounter (Signed)
-----   Message from [name], MD sent at 04/07/2018  1:04 PM EDT ----- No lymes

## 2018-04-16 ENCOUNTER — Telehealth: Payer: Self-pay | Admitting: Family Medicine

## 2018-04-16 ENCOUNTER — Ambulatory Visit: Payer: Managed Care, Other (non HMO) | Admitting: Family Medicine

## 2018-04-16 ENCOUNTER — Other Ambulatory Visit: Payer: Self-pay

## 2018-04-16 VITALS — BP 137/82 | HR 97 | Temp 98.2°F | Resp 16 | Wt 168.0 lb

## 2018-04-16 DIAGNOSIS — F329 Major depressive disorder, single episode, unspecified: Secondary | ICD-10-CM | POA: Diagnosis not present

## 2018-04-16 DIAGNOSIS — D72829 Elevated white blood cell count, unspecified: Secondary | ICD-10-CM

## 2018-04-16 DIAGNOSIS — R5383 Other fatigue: Secondary | ICD-10-CM

## 2018-04-16 DIAGNOSIS — F320 Major depressive disorder, single episode, mild: Secondary | ICD-10-CM

## 2018-04-16 DIAGNOSIS — I10 Essential (primary) hypertension: Secondary | ICD-10-CM | POA: Diagnosis not present

## 2018-04-16 DIAGNOSIS — E039 Hypothyroidism, unspecified: Secondary | ICD-10-CM | POA: Diagnosis not present

## 2018-04-16 NOTE — Progress Notes (Signed)
Rachel Noble  MRN: [id number] DOB: June 19, 1956  Subjective:  HPI   The patient is a 62 year old female who presents for follow up of fatigue and depression.  She was last seen on 03/18/18.  The patient was started on Bupropion and instructed to follow up today.   The patient states she was unclear about starting the medicine and thought she was not to start until after she discussed her labs.  She has not started the Bupropion and reports she is still very tired all the time.   Patient Active Problem List   Diagnosis Date Noted  . Hypertension 08/22/2016  . Hypertriglyceridemia 03/04/2014  . Hypothyroidism 12/03/2013  . Vitamin D deficiency 12/03/2013    Past Medical History:  Diagnosis Date  . Cancer ([place])    skin    Social History   Socioeconomic History  . Marital status: Married    Spouse name: Not on file  . Number of children: Not on file  . Years of education: Not on file  . Highest education level: Not on file  Occupational History  . Not on file  Social Needs  . Financial resource strain: Not on file  . Food insecurity:    Worry: Not on file    Inability: Not on file  . Transportation needs:    Medical: Not on file    Non-medical: Not on file  Tobacco Use  . Smoking status: Never Smoker  . Smokeless tobacco: Never Used  Substance and Sexual Activity  . Alcohol use: No  . Drug use: No  . Sexual activity: Not on file  Lifestyle  . Physical activity:    Days per week: Not on file    Minutes per session: Not on file  . Stress: Not on file  Relationships  . Social connections:    Talks on phone: Not on file    Gets together: Not on file    Attends religious service: Not on file    Active member of club or organization: Not on file    Attends meetings of clubs or organizations: Not on file    Relationship status: Not on file  . Intimate partner violence:    Fear of current or ex partner: Not on file    Emotionally abused: Not on file   Physically abused: Not on file    Forced sexual activity: Not on file  Other Topics Concern  . Not on file  Social History Narrative  . Not on file    Outpatient Encounter Medications as of 04/16/2018  Medication Sig Note  . cetirizine (ZYRTEC) 10 MG tablet Take 10 mg by mouth daily.   . Cholecalciferol (VITAMIN D-1000 MAX ST) 1000 UNITS tablet Take by mouth. 07/18/2015: Received from: [place]  . cyanocobalamin (,VITAMIN B-12,) 1000 MCG/ML injection Inject into the muscle. 07/18/2015: Received from: [place]  . estradiol (ESTRACE) 1 MG tablet Take by mouth.   [name] 1000 MG CAPS Take by mouth.   . nortriptyline (PAMELOR) 10 MG capsule Take 20 mg by mouth at bedtime.   [name] omeprazole (PRILOSEC) 40 MG capsule Take 1 capsule (40 mg total) by mouth once.   . thyroid (ARMOUR THYROID) 15 MG tablet Take by mouth. 07/18/2015: Received from: [facility]  . buPROPion (WELLBUTRIN XL) 150 MG 24 hr tablet Take 1 tablet (150 mg total) by mouth daily. (Patient not taking: Reported on 04/16/2018)   . DULoxetine (CYMBALTA) 20  MG capsule Take by mouth. 03/14/2016: Received from: [place]: Take 1 capsule (20 mg total) by mouth once daily.   No facility-administered encounter medications on file as of 04/16/2018.     Allergies  Allergen Reactions  . Tussionex Pennkinetic Er [Hydrocod Polst-Cpm Polst Er] Itching    Review of Systems  Constitutional: Positive for malaise/fatigue. Negative for fever.  HENT: Positive for tinnitus.   Eyes: Negative.   Respiratory: Negative for cough, shortness of breath and wheezing.   Cardiovascular: Negative for chest pain, palpitations, orthopnea, claudication and leg swelling.  Gastrointestinal: Negative.   Neurological: Negative.   Endo/Heme/Allergies: Negative.   Psychiatric/Behavioral: Positive for depression. Negative for memory loss. The patient has insomnia. The  patient is not nervous/anxious.     Objective:  BP 137/82 (BP Location: Right Arm, Patient Position: Sitting, Cuff Size: Large)   Pulse 97   Temp 98.2 F (36.8 C) (Oral)   Resp 16   Wt 168 lb (76.2 kg)   BMI 28.84 kg/m   Physical Exam  Constitutional: She is oriented to person, place, and time and well-developed, well-nourished, and in no distress.  HENT:  Head: Normocephalic and atraumatic.  Left Ear: External ear normal.  Nose: Nose normal.  Eyes: Conjunctivae are normal. No scleral icterus.  Neck: No thyromegaly present.  Cardiovascular: Normal rate and normal heart sounds.  Pulmonary/Chest: Breath sounds normal.  Abdominal: Soft.  Musculoskeletal: She exhibits no edema.  Lymphadenopathy:    She has no cervical adenopathy.  Neurological: She is alert and oriented to person, place, and time. Gait normal. GCS score is 15.  Skin: Skin is warm and dry.  Psychiatric: Mood, memory, affect and judgment normal.    Assessment and Plan :   Consider referral to hematology. 1. Leukocytosis, unspecified type   2. Acquired hypothyroidism   3. Essential hypertension   4. Fatigue due to depression   5. Depression, major, single episode, mild ([facility]) Start Buproprion now.Pt needs counseling and meds. RTC 1-2 months.   I have done the exam and reviewed the chart and it is accurate to the best of my knowledge. Development worker, community has been used and  any errors in dictation or transcription are unintentional. Miguel Aschoff M.D. [facility]

## 2018-04-16 NOTE — Telephone Encounter (Signed)
Please review

## 2018-04-16 NOTE — Telephone Encounter (Signed)
Pt stated after her OV with Dr. Rosanna Randy today she had an OV with Dr. Melrose Nakayama. Dr. Melrose Nakayama suggested that instead of pt taking buPROPion (WELLBUTRIN XL) 150 MG 24 hr tablet that Dr. Rosanna Randy switch her to Zoloft or Lexapro due to also taking DULoxetine (CYMBALTA) 20 MG capsule. Pt request that if Dr. Rosanna Randy is ok with pt changing to Zoloft or Lexapro to send Rx to [facility]. Please advise. Thanks TNP

## 2018-04-17 ENCOUNTER — Telehealth: Payer: Self-pay

## 2018-04-17 DIAGNOSIS — D72829 Elevated white blood cell count, unspecified: Secondary | ICD-10-CM

## 2018-04-17 LAB — CBC WITH DIFFERENTIAL/PLATELET
BASOS: 1 %
Basophils Absolute: 0.1 10*3/uL (ref 0.0–0.2)
EOS (ABSOLUTE): 0.1 10*3/uL (ref 0.0–0.4)
Eos: 1 %
Hematocrit: 40.6 % (ref 34.0–46.6)
Hemoglobin: 13.7 g/dL (ref 11.1–15.9)
IMMATURE GRANULOCYTES: 0 %
Immature Grans (Abs): 0 10*3/uL (ref 0.0–0.1)
LYMPHS ABS: 4.6 10*3/uL — AB (ref 0.7–3.1)
Lymphs: 37 %
MCH: 29.4 pg (ref 26.6–33.0)
MCHC: 33.7 g/dL (ref 31.5–35.7)
MCV: 87 fL (ref 79–97)
MONOS ABS: 0.7 10*3/uL (ref 0.1–0.9)
Monocytes: 6 %
NEUTROS PCT: 55 %
Neutrophils Absolute: 6.9 10*3/uL (ref 1.4–7.0)
PLATELETS: 367 10*3/uL (ref 150–450)
RBC: 4.66 x10E6/uL (ref 3.77–5.28)
RDW: 13.9 % (ref 12.3–15.4)
WBC: 12.4 10*3/uL — AB (ref 3.4–10.8)

## 2018-04-17 NOTE — Telephone Encounter (Signed)
Advised patient of results. Order for hematology was placed.

## 2018-04-17 NOTE — Telephone Encounter (Signed)
Patient is calling to check on the status of this.  Please advise.

## 2018-04-17 NOTE — Telephone Encounter (Signed)
-----   Message from [name], MD sent at 04/17/2018  8:15 AM EDT ----- Refer to hematology for mild leukocytosis. Pt aware we might be doing this.

## 2018-04-18 ENCOUNTER — Telehealth: Payer: Self-pay | Admitting: Family Medicine

## 2018-04-18 NOTE — Telephone Encounter (Signed)
Left message to call back  

## 2018-04-18 NOTE — Telephone Encounter (Signed)
Pt saw Dr. Melrose Nakayama w/ [facility].  He states pt needs to stay on the  DULoxetine (CYMBALTA) 20 MG capsule  Due to pt is getting help from this medication for current treatment.  He told pt that Lexapro or Zoloft would be a better fit for pt instead of th buPROPion (WELLBUTRIN XL) 150 MG 24 hr tablet Prescribed for pt due to them being in the same MSIR class.  Pt is wanting a call back to discuss before starting buPROPion (WELLBUTRIN XL) 150 MG 24 hr tablet.  Please advise.  Thanks, [employer]

## 2018-04-21 NOTE — Telephone Encounter (Signed)
Pt calling back to check on what Dr. Rosanna Randy wants her to do regarding which antidepressant to take. Please call pt back.  She states if she cannot answer the call to please leave a message.  Thanks, [employer]

## 2018-04-21 NOTE — Telephone Encounter (Signed)
Dr Rosanna Randy is this the one you were going to talk tot he doctor to see why he didn't want her to use the Bupropion? If so what do you want [place] to tell her.

## 2018-04-28 NOTE — Telephone Encounter (Signed)
It is okay to take the Wellbutrin.  It is in a different class than Cymbalta.

## 2018-04-28 NOTE — Telephone Encounter (Signed)
LMTCB

## 2018-04-28 NOTE — Telephone Encounter (Signed)
Patient advised. She states she had called over a week ago with no response, so she states Dr. Melrose Nakayama told her it is okay to take Wellbutrin, Cymbalta and Nortriptyline. She has been taking all 3 medications.

## 2018-05-14 ENCOUNTER — Encounter: Payer: Self-pay | Admitting: Oncology

## 2018-05-14 ENCOUNTER — Ambulatory Visit (INDEPENDENT_AMBULATORY_CARE_PROVIDER_SITE_OTHER): Payer: Managed Care, Other (non HMO) | Admitting: Family Medicine

## 2018-05-14 ENCOUNTER — Encounter: Payer: Self-pay | Admitting: Family Medicine

## 2018-05-14 ENCOUNTER — Other Ambulatory Visit: Payer: Self-pay

## 2018-05-14 ENCOUNTER — Inpatient Hospital Stay: Payer: Managed Care, Other (non HMO) | Attending: Oncology | Admitting: Oncology

## 2018-05-14 ENCOUNTER — Inpatient Hospital Stay: Payer: Managed Care, Other (non HMO)

## 2018-05-14 VITALS — BP 122/64 | HR 102 | Temp 98.7°F | Resp 16 | Ht 64.0 in | Wt 170.0 lb

## 2018-05-14 VITALS — BP 135/85 | HR 97 | Temp 96.9°F | Resp 18 | Ht 63.5 in | Wt 166.7 lb

## 2018-05-14 DIAGNOSIS — R5382 Chronic fatigue, unspecified: Secondary | ICD-10-CM | POA: Diagnosis not present

## 2018-05-14 DIAGNOSIS — Z79899 Other long term (current) drug therapy: Secondary | ICD-10-CM | POA: Insufficient documentation

## 2018-05-14 DIAGNOSIS — D72820 Lymphocytosis (symptomatic): Secondary | ICD-10-CM

## 2018-05-14 DIAGNOSIS — E039 Hypothyroidism, unspecified: Secondary | ICD-10-CM

## 2018-05-14 DIAGNOSIS — Z79890 Hormone replacement therapy (postmenopausal): Secondary | ICD-10-CM | POA: Diagnosis not present

## 2018-05-14 DIAGNOSIS — R5383 Other fatigue: Secondary | ICD-10-CM | POA: Diagnosis not present

## 2018-05-14 DIAGNOSIS — F329 Major depressive disorder, single episode, unspecified: Secondary | ICD-10-CM

## 2018-05-14 LAB — CBC WITH DIFFERENTIAL/PLATELET
Abs Immature Granulocytes: 0.03 10*3/uL (ref 0.00–0.07)
BASOS ABS: 0.1 10*3/uL (ref 0.0–0.1)
BASOS PCT: 1 %
EOS ABS: 0.1 10*3/uL (ref 0.0–0.5)
Eosinophils Relative: 1 %
HEMATOCRIT: 39.7 % (ref 36.0–46.0)
Hemoglobin: 13.3 g/dL (ref 12.0–15.0)
IMMATURE GRANULOCYTES: 0 %
LYMPHS ABS: 4.4 10*3/uL — AB (ref 0.7–4.0)
Lymphocytes Relative: 39 %
MCH: 29.3 pg (ref 26.0–34.0)
MCHC: 33.5 g/dL (ref 30.0–36.0)
MCV: 87.4 fL (ref 80.0–100.0)
Monocytes Absolute: 0.6 10*3/uL (ref 0.1–1.0)
Monocytes Relative: 5 %
NEUTROS PCT: 54 %
NRBC: 0 % (ref 0.0–0.2)
Neutro Abs: 6.2 10*3/uL (ref 1.7–7.7)
PLATELETS: 353 10*3/uL (ref 150–400)
RBC: 4.54 MIL/uL (ref 3.87–5.11)
RDW: 12.7 % (ref 11.5–15.5)
WBC: 11.4 10*3/uL — ABNORMAL HIGH (ref 4.0–10.5)

## 2018-05-14 LAB — URIC ACID: URIC ACID, SERUM: 5.3 mg/dL (ref 2.5–7.1)

## 2018-05-14 LAB — LACTATE DEHYDROGENASE: LDH: 97 U/L — ABNORMAL LOW (ref 98–192)

## 2018-05-14 LAB — TECHNOLOGIST SMEAR REVIEW: TECH REVIEW: NORMAL

## 2018-05-14 MED ORDER — DULOXETINE HCL 30 MG PO CPEP
30.0000 mg | ORAL_CAPSULE | Freq: Every day | ORAL | 11 refills | Status: DC
Start: 2018-05-14 — End: 2019-06-09

## 2018-05-14 NOTE — Progress Notes (Signed)
Patient here for initial visit. She is adopted so she doesn't know about any biological family history. All she wants to do all the time is "sleep"

## 2018-05-14 NOTE — Progress Notes (Signed)
Patient: Rachel Noble Female    DOB: 11/14/1955   62 y.o.   MRN: [id number] Visit Date: 05/14/2018  Today's Provider: Wilhemena Durie, MD   Chief Complaint  Patient presents with  . Follow-up   Subjective:    HPI Patient comes in today for a follow up. She was last seen in the office 1 month ago.  She was advised to start taking Bupropion along with the nortriptyline and Cymbalta. Patient reports that she is still feeling really fatigued and has to take naps throughout the day. She also reports that she has had a really dry mouth, and craving sweets. She feels that this could be coming from the medication.   Patient also has an appt today with the hematologist for leukocytosis evaluation.     Allergies  Allergen Reactions  . Tussionex Pennkinetic Er [Hydrocod Polst-Cpm Polst Er] Itching     Current Outpatient Medications:  .  buPROPion (WELLBUTRIN XL) 150 MG 24 hr tablet, Take 1 tablet (150 mg total) by mouth daily. (Patient not taking: Reported on 04/16/2018), Disp: 30 tablet, Rfl: 11 .  cetirizine (ZYRTEC) 10 MG tablet, Take 10 mg by mouth daily., Disp: , Rfl:  .  Cholecalciferol (VITAMIN D-1000 MAX ST) 1000 UNITS tablet, Take by mouth., Disp: , Rfl:  .  cyanocobalamin (,VITAMIN B-12,) 1000 MCG/ML injection, Inject into the muscle., Disp: , Rfl:  .  DULoxetine (CYMBALTA) 20 MG capsule, Take by mouth., Disp: , Rfl:  .  estradiol (ESTRACE) 1 MG tablet, Take by mouth., Disp: , Rfl:  .  Krill Oil 1000 MG CAPS, Take by mouth., Disp: , Rfl:  .  nortriptyline (PAMELOR) 10 MG capsule, Take 20 mg by mouth at bedtime., Disp: , Rfl:  .  omeprazole (PRILOSEC) 40 MG capsule, Take 1 capsule (40 mg total) by mouth once., Disp: 30 capsule, Rfl: 5 .  thyroid (ARMOUR THYROID) 15 MG tablet, Take by mouth., Disp: , Rfl:   Review of Systems  Constitutional: Positive for activity change, appetite change and fatigue.  Eyes: Negative.   Respiratory: Negative for cough and shortness  of breath.   Cardiovascular: Negative for chest pain, palpitations and leg swelling.  Gastrointestinal: Negative.   Endocrine: Positive for polydipsia.  Musculoskeletal: Negative for arthralgias.  Allergic/Immunologic: Negative.   Neurological: Negative for dizziness.    Social History   Tobacco Use  . Smoking status: Never Smoker  . Smokeless tobacco: Never Used  Substance Use Topics  . Alcohol use: No   Objective:   BP 122/64   Pulse (!) 102   Temp 98.7 F (37.1 C)   Resp 16   Ht 5\' 4"  (1.626 m)   Wt 170 lb (77.1 kg)   BMI 29.18 kg/m  Vitals:   05/14/18 0828  BP: 122/64  Pulse: (!) 102  Resp: 16  Temp: 98.7 F (37.1 C)  Weight: 170 lb (77.1 kg)  Height: 5\' 4"  (1.626 m)     Physical Exam  Constitutional: She is oriented to person, place, and time. She appears well-developed and well-nourished.  HENT:  Head: Normocephalic and atraumatic.  Mouth/Throat: Oropharynx is clear and moist.  Eyes: Conjunctivae are normal.  Cardiovascular: Normal rate and regular rhythm.  Pulmonary/Chest: Effort normal.  Musculoskeletal: She exhibits no edema.  Lymphadenopathy:    She has no cervical adenopathy.  Neurological: She is alert and oriented to person, place, and time.  Skin: Skin is warm and dry.  Psychiatric: She has a normal  mood and affect. Her behavior is normal. Judgment and thought content normal.        Assessment & Plan:     1. Fatigue due to depression RTC 4-6 weeks. Increase Cymbalta fro  20 to 30 mg daily. Stop Wellbutrin. - DULoxetine (CYMBALTA) 30 MG capsule; Take 1 capsule (30 mg total) by mouth daily.  Dispense: 30 capsule; Refill: 11  2. Acquired hypothyroidism      I have done the exam and reviewed the chart and it is accurate to the best of my knowledge. Development worker, community has been used and  any errors in dictation or transcription are unintentional. Miguel Aschoff M.D. [place], MD  [facility]

## 2018-05-14 NOTE — Progress Notes (Signed)
Hematology/Oncology Consult note [facility] [phone] [phone]   Patient Care Team: [name], MD as PCP - General (Family Medicine)  REFERRING PROVIDER: [name], MD  CHIEF COMPLAINTS/REASON FOR VISIT:  Evaluation of leukocytosis  HISTORY OF PRESENTING ILLNESS:  Rachel Noble is a  62 y.o.  female with PMH listed below who was referred to me for evaluation of leukocytosis Reviewed patient' recent labs obtained by PCP.  CBC showed elevated white count of 12.4, predominately lymphocytosis,  Previous lab records reviewed. Leukocytosis onset of chronic, duration is since at least 11/12/2017.  No aggravating or elevated factors. Associated symptoms or signs:  Denies weight loss, fever, chills,night sweats.  Feeling chronic Fatigue Smoking history: denies History of recent oral steroid use or steroid injection: denies History of recent infection: deneis Autoimmune disease history.  Denies Denies any chronic unhealed wound or infection.  On chronic hormone replacements with estradiol 1mg  daily.   Review of Systems  Constitutional: Positive for malaise/fatigue. Negative for chills, fever and weight loss.       Hot flush  HENT: Negative for nosebleeds and sore throat.   Eyes: Negative for double vision, photophobia and redness.  Respiratory: Negative for cough, shortness of breath and wheezing.   Cardiovascular: Negative for chest pain, palpitations and orthopnea.  Gastrointestinal: Negative for abdominal pain, blood in stool, nausea and vomiting.  Genitourinary: Negative for dysuria.  Musculoskeletal: Negative for back pain, myalgias and neck pain.  Skin: Negative for itching and rash.  Neurological: Negative for dizziness, tingling and tremors.  Endo/Heme/Allergies: Negative for environmental allergies. Does not bruise/bleed easily.  Psychiatric/Behavioral: Negative for depression.    MEDICAL  HISTORY:  Past Medical History:  Diagnosis Date  . Cancer ([place])    skin  . GERD (gastroesophageal reflux disease)   . Idiopathic neuropathy   . Thyroid disease     SURGICAL HISTORY: Past Surgical History:  Procedure Laterality Date  . ABDOMINAL HYSTERECTOMY    . bowel blockage    . CESAREAN SECTION     x 2  . CHOLECYSTECTOMY    . ELBOW SURGERY Right   . HERNIA REPAIR    . LASIK    . PLANTAR FASCIA SURGERY Right     SOCIAL HISTORY: Social History   Socioeconomic History  . Marital status: Married    Spouse name: Not on file  . Number of children: Not on file  . Years of education: Not on file  . Highest education level: Not on file  Occupational History  . Not on file  Social Needs  . Financial resource strain: Not on file  . Food insecurity:    Worry: Not on file    Inability: Not on file  . Transportation needs:    Medical: Not on file    Non-medical: Not on file  Tobacco Use  . Smoking status: Never Smoker  . Smokeless tobacco: Never Used  Substance and Sexual Activity  . Alcohol use: No  . Drug use: No  . Sexual activity: Not on file  Lifestyle  . Physical activity:    Days per week: Not on file    Minutes per session: Not on file  . Stress: Not on file  Relationships  . Social connections:    Talks on phone: Not on file    Gets together: Not on file    Attends religious service: Not on file    Active member of club or organization: Not on file  Attends meetings of clubs or organizations: Not on file    Relationship status: Not on file  . Intimate partner violence:    Fear of current or ex partner: Not on file    Emotionally abused: Not on file    Physically abused: Not on file    Forced sexual activity: Not on file  Other Topics Concern  . Not on file  Social History Narrative  . Not on file    FAMILY HISTORY: Family History  Adopted: Yes  Problem Relation Age of Onset  . Breast cancer Neg Hx     ALLERGIES:  is allergic to  tussionex pennkinetic er [hydrocod polst-cpm polst er].  MEDICATIONS:  Current Outpatient Medications  Medication Sig Dispense Refill  . calcium citrate-vitamin D 500-400 MG-UNIT chewable tablet Chew by mouth daily.    . cetirizine (ZYRTEC) 10 MG tablet Take 10 mg by mouth daily.    . Cholecalciferol (VITAMIN D-1000 MAX ST) 1000 UNITS tablet Take 1,000 Units by mouth daily.     . cyanocobalamin (,VITAMIN B-12,) 1000 MCG/ML injection Inject 1,000 mcg into the muscle every 30 (thirty) days.     . DULoxetine (CYMBALTA) 30 MG capsule Take 1 capsule (30 mg total) by mouth daily. 30 capsule 11  . estradiol (ESTRACE) 1 MG tablet Take by mouth.    . ferrous sulfate 325 (65 FE) MG tablet Take 325 mg by mouth daily.    [name] 1000 MG CAPS Take by mouth.    . magnesium oxide (MAG-OX) 400 MG tablet Take 400 mg by mouth daily.    . nortriptyline (PAMELOR) 10 MG capsule Take 20 mg by mouth at bedtime.    . pantoprazole (PROTONIX) 40 MG tablet TAKE 1 TABLET BY MOUTH DAILY 30 MINUTES BEFORE A MEAL.    [name] thyroid (ARMOUR THYROID) 15 MG tablet Take by mouth.    [name] buPROPion (WELLBUTRIN XL) 150 MG 24 hr tablet Take 1 tablet (150 mg total) by mouth daily. (Patient not taking: Reported on 04/16/2018) 30 tablet 11  . omeprazole (PRILOSEC) 40 MG capsule Take 1 capsule (40 mg total) by mouth once. (Patient not taking: Reported on 05/14/2018) 30 capsule 5   No current facility-administered medications for this visit.      PHYSICAL EXAMINATION: ECOG PERFORMANCE STATUS: 0 - Asymptomatic Vitals:   05/14/18 0951 05/14/18 1006  BP: 135/85   Pulse:  97  Resp:  18  Temp: (!) 96.9 F (36.1 C)    Filed Weights   05/14/18 0951  Weight: 166 lb 11.2 oz (75.6 kg)    Physical Exam  Constitutional: She is oriented to person, place, and time. No distress.  HENT:  Head: Normocephalic and atraumatic.  Mouth/Throat: Oropharynx is clear and moist.  Eyes: Pupils are equal, round, and reactive to light. EOM are  normal. No scleral icterus.  Neck: Normal range of motion. Neck supple.  Cardiovascular: Normal rate, regular rhythm and normal heart sounds.  Pulmonary/Chest: Effort normal. No respiratory distress. She has no wheezes.  Abdominal: Soft. Bowel sounds are normal. She exhibits no distension and no mass. There is no tenderness.  Musculoskeletal: Normal range of motion. She exhibits no edema or deformity.  Neurological: She is alert and oriented to person, place, and time. No cranial nerve deficit. Coordination normal.  Skin: Skin is warm and dry. No rash noted. No erythema.  Psychiatric: She has a normal mood and affect. Her behavior is normal. Thought content normal.    CMP Latest Ref Rng &  Units 03/18/2018  Glucose 65 - 99 mg/dL 116(H)  BUN 8 - 27 mg/dL 10  Creatinine 0.57 - 1.00 mg/dL 0.77  Sodium 134 - 144 mmol/L 139  Potassium 3.5 - 5.2 mmol/L 4.6  Chloride 96 - 106 mmol/L 99  CO2 20 - 29 mmol/L 24  Calcium 8.7 - 10.3 mg/dL 9.8  Total Protein 6.0 - 8.5 g/dL 6.7  Total Bilirubin 0.0 - 1.2 mg/dL <0.2  Alkaline Phos 39 - 117 IU/L 92  AST 0 - 40 IU/L 16  ALT 0 - 32 IU/L 11   CBC Latest Ref Rng & Units 05/14/2018  WBC 4.0 - 10.5 K/uL 11.4(H)  Hemoglobin 12.0 - 15.0 g/dL 13.3  Hematocrit 36.0 - 46.0 % 39.7  Platelets 150 - 400 K/uL 353   RADIOGRAPHIC STUDIES: I have personally reviewed the radiological images as listed and agreed with the findings in the report. 09/17/2017 Mammogram No mammographic evidence of malignancy  LABORATORY DATA:  I have reviewed the data as listed Lab Results  Component Value Date   WBC 11.4 (H) 05/14/2018   HGB 13.3 05/14/2018   HCT 39.7 05/14/2018   MCV 87.4 05/14/2018   PLT 353 05/14/2018   Recent Labs    03/18/18 1528  NA 139  K 4.6  CL 99  CO2 24  GLUCOSE 116*  BUN 10  CREATININE 0.77  CALCIUM 9.8  GFRNONAA 84  GFRAA 97  PROT 6.7  ALBUMIN 4.2  AST 16  ALT 11  ALKPHOS 92  BILITOT <0.2   Iron/TIBC/Ferritin/ %Sat    Component  Value Date/Time   IRON 77 11/12/2016 0800        ASSESSMENT & PLAN:  1. Lymphocytosis   2. Chronic fatigue   3. Hormone replacement therapy (HRT)    Labs reviewed and discussed with patient that Leukocytosis, predominantly neutrophilia, can be secondary to infection, chronic inflammation, smoking, autoimmune disease, or underlying bone marrow disorders.   For the work up of patient's leukocytosis, I recommend checking CBC;CMP, LDH, smear review, flowcytometry, etc.  # Chronic fatigue. Check TSH # Chronic hormone replacement. Mammogram independently reviewed. Up to date. History of hystectomy. Self reports ovaries also were taken out.    Orders Placed This Encounter  Procedures  . CBC with Differential/Platelet    Standing Status:   Future    Number of Occurrences:   1    Standing Expiration Date:   05/15/2019  . Technologist smear review    Standing Status:   Future    Number of Occurrences:   1    Standing Expiration Date:   05/15/2019  . Flow cytometry panel-leukemia/lymphoma work-up    Standing Status:   Future    Number of Occurrences:   1    Standing Expiration Date:   05/15/2019  . Lactate dehydrogenase    Standing Status:   Future    Number of Occurrences:   1    Standing Expiration Date:   05/15/2019  . Uric acid    Standing Status:   Future    Number of Occurrences:   1    Standing Expiration Date:   05/15/2019    All questions were answered. The patient knows to call the clinic with any problems questions or concerns.  Return of visit: 2 weeks to discuss labs. Thank you for this kind referral and the opportunity to participate in the care of this patient. A copy of today's note is routed to referring provider  Total face to face encounter  time for this patient visit was 45 min. >50% of the time was  spent in counseling and coordination of care.    Earlie Server, MD, PhD Hematology Oncology [facility] at [facility] Pager-  [phone] 05/14/2018

## 2018-05-14 NOTE — Patient Instructions (Signed)
Discontinue Wellbutrin

## 2018-05-19 LAB — COMP PANEL: LEUKEMIA/LYMPHOMA

## 2018-05-21 ENCOUNTER — Other Ambulatory Visit: Payer: Self-pay

## 2018-05-21 DIAGNOSIS — E039 Hypothyroidism, unspecified: Secondary | ICD-10-CM

## 2018-05-21 NOTE — Progress Notes (Signed)
Ordering Provider not listed in epic, [facility] form filed out and sent with labs. Lab orders were faxed from  [facility],  [place]  Ordering Provider: Drema Halon NP  NPI: [id number]  Labs Collected TSH and Vitamin D 05/21/18

## 2018-05-22 LAB — VITAMIN D 25 HYDROXY (VIT D DEFICIENCY, FRACTURES): VIT D 25 HYDROXY: 26.2 ng/mL — AB (ref 30.0–100.0)

## 2018-05-22 LAB — TSH: TSH: 3.01 u[IU]/mL (ref 0.450–4.500)

## 2018-05-22 NOTE — Progress Notes (Signed)
Lab Results from 05/21/18 For TSH and Vitamin D were received and faxed to Ordering Provider Malissa Hippo, NP at [phone]

## 2018-05-23 NOTE — Addendum Note (Signed)
Addended by: Judie Petit on: 05/23/2018 02:12 PM   Modules accepted: Level of Service

## 2018-06-04 ENCOUNTER — Other Ambulatory Visit: Payer: Self-pay

## 2018-06-04 ENCOUNTER — Encounter: Payer: Self-pay | Admitting: Oncology

## 2018-06-04 ENCOUNTER — Inpatient Hospital Stay: Payer: Managed Care, Other (non HMO)

## 2018-06-04 ENCOUNTER — Inpatient Hospital Stay: Payer: Managed Care, Other (non HMO) | Attending: Oncology | Admitting: Oncology

## 2018-06-04 VITALS — BP 136/83 | HR 96 | Temp 97.8°F | Resp 16 | Wt 169.1 lb

## 2018-06-04 DIAGNOSIS — R5382 Chronic fatigue, unspecified: Secondary | ICD-10-CM | POA: Diagnosis not present

## 2018-06-04 DIAGNOSIS — D72820 Lymphocytosis (symptomatic): Secondary | ICD-10-CM | POA: Diagnosis not present

## 2018-06-04 DIAGNOSIS — Z79899 Other long term (current) drug therapy: Secondary | ICD-10-CM | POA: Diagnosis not present

## 2018-06-04 DIAGNOSIS — Z79890 Hormone replacement therapy (postmenopausal): Secondary | ICD-10-CM | POA: Diagnosis not present

## 2018-06-04 DIAGNOSIS — G609 Hereditary and idiopathic neuropathy, unspecified: Secondary | ICD-10-CM

## 2018-06-04 LAB — CBC WITH DIFFERENTIAL/PLATELET
ABS IMMATURE GRANULOCYTES: 0.03 10*3/uL (ref 0.00–0.07)
BASOS ABS: 0.1 10*3/uL (ref 0.0–0.1)
Basophils Relative: 1 %
EOS ABS: 0.1 10*3/uL (ref 0.0–0.5)
Eosinophils Relative: 1 %
HEMATOCRIT: 40.2 % (ref 36.0–46.0)
Hemoglobin: 13.1 g/dL (ref 12.0–15.0)
IMMATURE GRANULOCYTES: 0 %
LYMPHS ABS: 4.8 10*3/uL — AB (ref 0.7–4.0)
Lymphocytes Relative: 41 %
MCH: 29 pg (ref 26.0–34.0)
MCHC: 32.6 g/dL (ref 30.0–36.0)
MCV: 88.9 fL (ref 80.0–100.0)
MONOS PCT: 5 %
Monocytes Absolute: 0.6 10*3/uL (ref 0.1–1.0)
NEUTROS ABS: 6.1 10*3/uL (ref 1.7–7.7)
NEUTROS PCT: 52 %
NRBC: 0 % (ref 0.0–0.2)
PLATELETS: 349 10*3/uL (ref 150–400)
RBC: 4.52 MIL/uL (ref 3.87–5.11)
RDW: 12.9 % (ref 11.5–15.5)
WBC: 11.8 10*3/uL — ABNORMAL HIGH (ref 4.0–10.5)

## 2018-06-04 NOTE — Progress Notes (Signed)
Patient here today for follow up.  Patient states no new concerns today  

## 2018-06-05 LAB — KAPPA/LAMBDA LIGHT CHAINS
Kappa free light chain: 11.6 mg/L (ref 3.3–19.4)
Kappa, lambda light chain ratio: 0.89 (ref 0.26–1.65)
Lambda free light chains: 13 mg/L (ref 5.7–26.3)

## 2018-06-05 NOTE — Progress Notes (Signed)
Hematology/Oncology follow up note [facility] [phone] [phone]   Patient Care Team: [name], MD as PCP - General (Family Medicine)  REFERRING PROVIDER: [name], MD  CHIEF COMPLAINTS/REASON FOR VISIT:  Evaluation of leukocytosis  HISTORY OF PRESENTING ILLNESS:  Rachel Noble is a  62 y.o.  female with PMH listed below who was referred to me for evaluation of leukocytosis Reviewed patient' recent labs obtained by PCP.  CBC showed elevated white count of 12.4, predominately lymphocytosis,  Previous lab records reviewed. Leukocytosis onset of chronic, duration is since at least 11/12/2017.  No aggravating or elevated factors. Associated symptoms or signs:  Denies weight loss, fever, chills,night sweats.  Feeling chronic Fatigue Smoking history: denies History of recent oral steroid use or steroid injection: denies History of recent infection: deneis Autoimmune disease history.  Denies Denies any chronic unhealed wound or infection.  On chronic hormone replacements with estradiol 2m daily.   IRhodhissis a 62y.o. female who has above history reviewed by me today presents for follow up visit for management of leukocytosis She has had work up done during the interval and presents for follow up.   She also endorses lower extremity neuropathy and she sees neurology Dr.Potter. She was recommended to take cymbalta 444mat night.   Review of Systems  Constitutional: Positive for malaise/fatigue. Negative for chills, fever and weight loss.       Hot flash  HENT: Negative for nosebleeds and sore throat.   Eyes: Negative for double vision, photophobia and redness.  Respiratory: Negative for cough, shortness of breath and wheezing.   Cardiovascular: Negative for chest pain, palpitations and orthopnea.  Gastrointestinal: Negative for abdominal pain, blood in stool, nausea and  vomiting.  Genitourinary: Negative for dysuria.  Musculoskeletal: Negative for back pain, myalgias and neck pain.  Skin: Negative for itching and rash.  Neurological: Positive for tingling. Negative for dizziness and tremors.  Endo/Heme/Allergies: Negative for environmental allergies. Does not bruise/bleed easily.  Psychiatric/Behavioral: Negative for depression.    MEDICAL HISTORY:  Past Medical History:  Diagnosis Date  . Cancer ([place]   skin  . GERD (gastroesophageal reflux disease)   . Idiopathic neuropathy   . Thyroid disease     SURGICAL HISTORY: Past Surgical History:  Procedure Laterality Date  . ABDOMINAL HYSTERECTOMY    . bowel blockage    . CESAREAN SECTION     x 2  . CHOLECYSTECTOMY    . ELBOW SURGERY Right   . HERNIA REPAIR    . LASIK    . PLANTAR FASCIA SURGERY Right     SOCIAL HISTORY: Social History   Socioeconomic History  . Marital status: Married    Spouse name: Not on file  . Number of children: Not on file  . Years of education: Not on file  . Highest education level: Not on file  Occupational History  . Not on file  Social Needs  . Financial resource strain: Not on file  . Food insecurity:    Worry: Not on file    Inability: Not on file  . Transportation needs:    Medical: Not on file    Non-medical: Not on file  Tobacco Use  . Smoking status: Never Smoker  . Smokeless tobacco: Never Used  Substance and Sexual Activity  . Alcohol use: No  . Drug use: No  . Sexual activity: Not on file  Lifestyle  . Physical activity:  Days per week: Not on file    Minutes per session: Not on file  . Stress: Not on file  Relationships  . Social connections:    Talks on phone: Not on file    Gets together: Not on file    Attends religious service: Not on file    Active member of club or organization: Not on file    Attends meetings of clubs or organizations: Not on file    Relationship status: Not on file  . Intimate partner violence:     Fear of current or ex partner: Not on file    Emotionally abused: Not on file    Physically abused: Not on file    Forced sexual activity: Not on file  Other Topics Concern  . Not on file  Social History Narrative  . Not on file    FAMILY HISTORY: Family History  Adopted: Yes  Problem Relation Age of Onset  . Breast cancer Neg Hx     ALLERGIES:  is allergic to tussionex pennkinetic er [hydrocod polst-cpm polst er].  MEDICATIONS:  Current Outpatient Medications  Medication Sig Dispense Refill  . calcium citrate-vitamin D 500-400 MG-UNIT chewable tablet Chew by mouth daily.    . cetirizine (ZYRTEC) 10 MG tablet Take 10 mg by mouth daily.    . cyanocobalamin (,VITAMIN B-12,) 1000 MCG/ML injection Inject 1,000 mcg into the muscle every 30 (thirty) days.     . DULoxetine (CYMBALTA) 30 MG capsule Take 1 capsule (30 mg total) by mouth daily. 30 capsule 11  . estradiol (ESTRACE) 1 MG tablet Take by mouth.    . ferrous sulfate 325 (65 FE) MG tablet Take 325 mg by mouth daily.    [name] 1000 MG CAPS Take by mouth.    . magnesium oxide (MAG-OX) 400 MG tablet Take 400 mg by mouth daily.    . nortriptyline (PAMELOR) 10 MG capsule Take 20 mg by mouth at bedtime.    [name] omeprazole (PRILOSEC) 40 MG capsule Take 1 capsule (40 mg total) by mouth once. 30 capsule 5  . pantoprazole (PROTONIX) 40 MG tablet TAKE 1 TABLET BY MOUTH DAILY 30 MINUTES BEFORE A MEAL.    [name] thyroid (ARMOUR THYROID) 15 MG tablet Take by mouth.    . Vitamin D, Ergocalciferol, (DRISDOL) 1.25 MG (50000 UT) CAPS capsule Take 50,000 Units by mouth once a week.     No current facility-administered medications for this visit.      PHYSICAL EXAMINATION: ECOG PERFORMANCE STATUS: 0 - Asymptomatic Vitals:   06/04/18 1320  BP: 136/83  Pulse: 96  Resp: 16  Temp: 97.8 F (36.6 C)   Filed Weights   06/04/18 1320  Weight: 169 lb 1 oz (76.7 kg)    Physical Exam  Constitutional: She is oriented to person, place, and time.  No distress.  HENT:  Head: Normocephalic and atraumatic.  Mouth/Throat: Oropharynx is clear and moist.  Eyes: Pupils are equal, round, and reactive to light. EOM are normal. No scleral icterus.  Neck: Normal range of motion. Neck supple.  Cardiovascular: Normal rate, regular rhythm and normal heart sounds.  Pulmonary/Chest: Effort normal. No respiratory distress. She has no wheezes.  Abdominal: Soft. Bowel sounds are normal. She exhibits no distension and no mass. There is no tenderness.  Musculoskeletal: Normal range of motion. She exhibits no edema or deformity.  Neurological: She is alert and oriented to person, place, and time. No cranial nerve deficit. Coordination normal.  Skin: Skin is warm  and dry. No rash noted. No erythema.  Psychiatric: She has a normal mood and affect. Her behavior is normal. Thought content normal.    CMP Latest Ref Rng & Units 03/18/2018  Glucose 65 - 99 mg/dL 116(H)  BUN 8 - 27 mg/dL 10  Creatinine 0.57 - 1.00 mg/dL 0.77  Sodium 134 - 144 mmol/L 139  Potassium 3.5 - 5.2 mmol/L 4.6  Chloride 96 - 106 mmol/L 99  CO2 20 - 29 mmol/L 24  Calcium 8.7 - 10.3 mg/dL 9.8  Total Protein 6.0 - 8.5 g/dL 6.7  Total Bilirubin 0.0 - 1.2 mg/dL <0.2  Alkaline Phos 39 - 117 IU/L 92  AST 0 - 40 IU/L 16  ALT 0 - 32 IU/L 11   CBC Latest Ref Rng & Units 06/04/2018  WBC 4.0 - 10.5 K/uL 11.8(H)  Hemoglobin 12.0 - 15.0 g/dL 13.1  Hematocrit 36.0 - 46.0 % 40.2  Platelets 150 - 400 K/uL 349   RADIOGRAPHIC STUDIES: I have personally reviewed the radiological images as listed and agreed with the findings in the report. 09/17/2017 Mammogram No mammographic evidence of malignancy  LABORATORY DATA:  I have reviewed the data as listed Lab Results  Component Value Date   WBC 11.8 (H) 06/04/2018   HGB 13.1 06/04/2018   HCT 40.2 06/04/2018   MCV 88.9 06/04/2018   PLT 349 06/04/2018   Recent Labs    03/18/18 1528  NA 139  K 4.6  CL 99  CO2 24  GLUCOSE 116*  BUN 10    CREATININE 0.77  CALCIUM 9.8  GFRNONAA 84  GFRAA 97  PROT 6.7  ALBUMIN 4.2  AST 16  ALT 11  ALKPHOS 92  BILITOT <0.2   Iron/TIBC/Ferritin/ %Sat    Component Value Date/Time   IRON 77 11/12/2016 0800    RADIOGRAPHIC STUDIES: I have personally reviewed the radiological images as listed and agreed with the findings in the report. 09/17/2017 screening mammogram bilateral no mammographic evidence of malignancy.   ASSESSMENT & PLAN:  1. Lymphocytosis   2. Chronic fatigue   3. Hormone replacement therapy (HRT)   4. Idiopathic neuropathy    Labs are reviewed and discussed with patient.  Smear review showed normal RBC and WBC morphology. Flow cytometry showed no diagnostic immunophenotypic [place], absolute lymphocytosis due to CD4 T cells and double positive T cells. Increasing multiple lymphocyte subset suggest a reactive lymphocytosis. Leukocytosis trending down. Discussed with patient that show any concerning appointment and given that white count is trending down,.  We will hold additional work-up  # Chronic fatigue.  Normal TSH.   #Neuropathy, idiopathic we will check multiple myeloma panel deterioration. # Chronic hormone replacement.  Mammogram up-to-date.  Independently reviewed by me.  Advised patient to discuss with gynecology of the necessity of continuing hormone replacement.    Orders Placed This Encounter  Procedures  . Kappa/lambda light chains    Standing Status:   Future    Number of Occurrences:   1    Standing Expiration Date:   06/05/2019  . Multiple Myeloma Panel (SPEP&IFE w/QIG)    Standing Status:   Future    Number of Occurrences:   1    Standing Expiration Date:   06/05/2019  . CBC with Differential/Platelet    Standing Status:   Future    Number of Occurrences:   1    Standing Expiration Date:   09/05/2019    All questions were answered. The patient knows to call the clinic with  any problems questions or concerns.  Return of visit: 1 year   Earlie Server, MD, PhD Hematology Oncology [facility] at [facility] Pager- [phone] 06/05/2018

## 2018-06-09 LAB — MULTIPLE MYELOMA PANEL, SERUM
ALBUMIN SERPL ELPH-MCNC: 3.7 g/dL (ref 2.9–4.4)
Albumin/Glob SerPl: 1.2 (ref 0.7–1.7)
Alpha 1: 0.3 g/dL (ref 0.0–0.4)
Alpha2 Glob SerPl Elph-Mcnc: 0.8 g/dL (ref 0.4–1.0)
B-Globulin SerPl Elph-Mcnc: 1.1 g/dL (ref 0.7–1.3)
GAMMA GLOB SERPL ELPH-MCNC: 1 g/dL (ref 0.4–1.8)
GLOBULIN, TOTAL: 3.1 g/dL (ref 2.2–3.9)
IGA: 171 mg/dL (ref 87–352)
IGM (IMMUNOGLOBULIN M), SRM: 112 mg/dL (ref 26–217)
IgG (Immunoglobin G), Serum: 999 mg/dL (ref 700–1600)
Total Protein ELP: 6.8 g/dL (ref 6.0–8.5)

## 2018-06-11 ENCOUNTER — Ambulatory Visit (INDEPENDENT_AMBULATORY_CARE_PROVIDER_SITE_OTHER): Payer: Managed Care, Other (non HMO) | Admitting: Family Medicine

## 2018-06-11 ENCOUNTER — Other Ambulatory Visit: Payer: Self-pay

## 2018-06-11 ENCOUNTER — Encounter: Payer: Self-pay | Admitting: Family Medicine

## 2018-06-11 VITALS — BP 140/80 | HR 99 | Temp 97.6°F | Ht 64.0 in | Wt 172.0 lb

## 2018-06-11 DIAGNOSIS — R5383 Other fatigue: Secondary | ICD-10-CM

## 2018-06-11 DIAGNOSIS — Z9229 Personal history of other drug therapy: Secondary | ICD-10-CM | POA: Diagnosis not present

## 2018-06-11 DIAGNOSIS — E781 Pure hyperglyceridemia: Secondary | ICD-10-CM

## 2018-06-11 DIAGNOSIS — Z299 Encounter for prophylactic measures, unspecified: Secondary | ICD-10-CM

## 2018-06-11 DIAGNOSIS — E039 Hypothyroidism, unspecified: Secondary | ICD-10-CM | POA: Diagnosis not present

## 2018-06-11 NOTE — Progress Notes (Signed)
Patient: Rachel Noble Female    DOB: 10/25/1955   62 y.o.   MRN: [id number] Visit Date: 06/11/2018  Today's Provider: Wilhemena Durie, MD   Chief Complaint  Patient presents with  . Follow-up    1 month for medication and hemotology visit   Subjective:    HPI  Pt reports she is here for a 1 month follow up from seeing hematologist and also starting Cymbalta.  Pt states she doesn't see any difference in the tired feeling she has been having.  Medication that Dr Melrose Nakayama put her on is helping her feet (nortryptilyne).     Husband is present today for visit. She claims this all started 2 years ago after a tick bite.  She sees endocrine for her thyroid.     No Active Allergies   Current Outpatient Medications:  .  calcium citrate-vitamin D 500-400 MG-UNIT chewable tablet, Chew by mouth daily., Disp: , Rfl:  .  cetirizine (ZYRTEC) 10 MG tablet, Take 10 mg by mouth daily., Disp: , Rfl:  .  cyanocobalamin (,VITAMIN B-12,) 1000 MCG/ML injection, Inject 1,000 mcg into the muscle every 30 (thirty) days. , Disp: , Rfl:  .  DULoxetine (CYMBALTA) 30 MG capsule, Take 1 capsule (30 mg total) by mouth daily., Disp: 30 capsule, Rfl: 11 .  estradiol (ESTRACE) 1 MG tablet, Take by mouth., Disp: , Rfl:  .  ferrous sulfate 325 (65 FE) MG tablet, Take 325 mg by mouth daily., Disp: , Rfl:  .  Krill Oil 1000 MG CAPS, Take by mouth., Disp: , Rfl:  .  magnesium oxide (MAG-OX) 400 MG tablet, Take 400 mg by mouth daily., Disp: , Rfl:  .  nortriptyline (PAMELOR) 10 MG capsule, Take 20 mg by mouth at bedtime., Disp: , Rfl:  .  pantoprazole (PROTONIX) 40 MG tablet, TAKE 1 TABLET BY MOUTH DAILY 30 MINUTES BEFORE A MEAL., Disp: , Rfl:  .  thyroid (ARMOUR THYROID) 15 MG tablet, Take by mouth., Disp: , Rfl:  .  Vitamin D, Ergocalciferol, (DRISDOL) 1.25 MG (50000 UT) CAPS capsule, Take 50,000 Units by mouth once a week., Disp: , Rfl:  .  omeprazole (PRILOSEC) 40 MG capsule, Take 1 capsule (40 mg  total) by mouth once. (Patient not taking: Reported on 06/11/2018), Disp: 30 capsule, Rfl: 5  Review of Systems  Constitutional: Positive for fatigue. Negative for activity change, appetite change, chills, diaphoresis, fever and unexpected weight change.  HENT: Negative.   Eyes: Negative.   Respiratory: Negative.   Cardiovascular: Negative.   Gastrointestinal: Negative.   Endocrine: Negative.   Genitourinary: Negative.   Skin: Negative.   Allergic/Immunologic: Negative.   Neurological: Negative.   Hematological: Negative.   Psychiatric/Behavioral: Negative.     Social History   Tobacco Use  . Smoking status: Never Smoker  . Smokeless tobacco: Never Used  Substance Use Topics  . Alcohol use: No   Objective:   BP 140/80 (BP Location: Left Arm, Patient Position: Sitting, Cuff Size: Normal)   Pulse 99   Temp 97.6 F (36.4 C) (Oral)   Ht 5\' 4"  (1.626 m)   Wt 172 lb (78 kg)   SpO2 99%   BMI 29.52 kg/m  Vitals:   06/11/18 1448  BP: 140/80  Pulse: 99  Temp: 97.6 F (36.4 C)  TempSrc: Oral  SpO2: 99%  Weight: 172 lb (78 kg)  Height: 5\' 4"  (1.626 m)     Physical Exam  Constitutional: She is oriented  to person, place, and time. She appears well-developed and well-nourished.  HENT:  Head: Normocephalic and atraumatic.  Right Ear: External ear normal.  Left Ear: External ear normal.  Nose: Nose normal.  Mouth/Throat: Oropharynx is clear and moist.  Eyes: Conjunctivae are normal. No scleral icterus.  Neck: No thyromegaly present.  Cardiovascular: Normal rate, regular rhythm and normal heart sounds.  Pulmonary/Chest: Effort normal and breath sounds normal.  Abdominal: Soft.  Musculoskeletal: She exhibits no edema.  Lymphadenopathy:    She has no cervical adenopathy.  Neurological: She is alert and oriented to person, place, and time.  Skin: Skin is warm and dry.  Psychiatric: She has a normal mood and affect. Her behavior is normal. Judgment and thought content  normal.        Assessment & Plan:     1. Other fatigue More than 50% of 30 minute visit spent counseling /coordination of care. I have no definative diagnosis for fatige for pt. Depression is best fit but pt/husband do not think this is issue. Could this be postinfectious? - Vitamin D 1,25 dihydroxy  2. Acquired hypothyroidism Refer back to endocrine.  3. Hypertriglyceridemia   4. History of postmenopausal HRT Stop Estradiol as this was simply started at time of hysterectomy--never symptomatic.      I have done the exam and reviewed the above chart and it is accurate to the best of my knowledge. Development worker, community has been used in this note in any air is in the dictation or transcription are unintentional.  Wilhemena Durie, MD  [place]

## 2018-06-11 NOTE — Patient Instructions (Signed)
Patient is to stop Estradiol  Patient is to have lab done  Patient is to follow up in 2 months

## 2018-06-12 LAB — ANA: ANA: NEGATIVE

## 2018-06-12 LAB — VITAMIN D 25 HYDROXY (VIT D DEFICIENCY, FRACTURES): VIT D 25 HYDROXY: 29.9 ng/mL — AB (ref 30.0–100.0)

## 2018-06-12 LAB — THYROID PANEL WITH TSH
Free Thyroxine Index: 0.8 — ABNORMAL LOW (ref 1.2–4.9)
T3 Uptake Ratio: 13 % — ABNORMAL LOW (ref 24–39)
T4, Total: 6.1 ug/dL (ref 4.5–12.0)
TSH: 2.54 u[IU]/mL (ref 0.450–4.500)

## 2018-06-12 LAB — SEDIMENTATION RATE: Sed Rate: 35 mm/hr (ref 0–40)

## 2018-06-12 NOTE — Progress Notes (Signed)
Lab results have been routed to the ordering provider Wilhemena Durie [place]

## 2018-06-17 ENCOUNTER — Telehealth: Payer: Self-pay | Admitting: *Deleted

## 2018-06-17 NOTE — Telephone Encounter (Signed)
Left message to call back  

## 2018-06-17 NOTE — Telephone Encounter (Signed)
Pt returning call

## 2018-06-17 NOTE — Telephone Encounter (Signed)
LMOVM for pt to return call 

## 2018-06-17 NOTE — Telephone Encounter (Signed)
Advised patient of results.  

## 2018-06-17 NOTE — Telephone Encounter (Signed)
-----   Message from [name], MD sent at 06/13/2018 10:16 AM EST ----- Labs ok--defer thyroid to Endocrinology.

## 2018-06-17 NOTE — Telephone Encounter (Signed)
Pt returning missed call. ° °Please call pt back. ° °Thanks, °[facility] °

## 2018-07-03 ENCOUNTER — Other Ambulatory Visit: Payer: Self-pay

## 2018-07-03 DIAGNOSIS — E538 Deficiency of other specified B group vitamins: Secondary | ICD-10-CM

## 2018-07-03 NOTE — Addendum Note (Signed)
Addended by: Judie Petit on: 07/03/2018 01:52 PM   Modules accepted: Orders, Level of Service

## 2018-07-04 ENCOUNTER — Telehealth: Payer: Self-pay

## 2018-07-04 NOTE — Telephone Encounter (Signed)
Yes.  Thank you.

## 2018-07-04 NOTE — Telephone Encounter (Signed)
The patient requested her results from her lab draw 07/03/18 be routed to the ordering provider as well as a printed copy be mailed to her Address. I will send the results once they are received.

## 2018-07-07 LAB — HOMOCYSTEINE: HOMOCYSTEINE: 7.6 umol/L (ref 0.0–15.0)

## 2018-07-07 LAB — METHYLMALONIC ACID, SERUM: METHYLMALONIC ACID: 91 nmol/L (ref 0–378)

## 2018-07-07 LAB — VITAMIN B12: Vitamin B-12: 1449 pg/mL — ABNORMAL HIGH (ref 232–1245)

## 2018-07-07 NOTE — Progress Notes (Signed)
Lab Results from 07/03/18 have been routed to the Ordering Provider Glenis Smoker CNM 07/07/18 -[place]

## 2018-07-08 ENCOUNTER — Other Ambulatory Visit: Payer: Self-pay | Admitting: Obstetrics and Gynecology

## 2018-07-08 DIAGNOSIS — Z1231 Encounter for screening mammogram for malignant neoplasm of breast: Secondary | ICD-10-CM

## 2018-08-06 ENCOUNTER — Other Ambulatory Visit: Payer: Self-pay

## 2018-08-06 DIAGNOSIS — E039 Hypothyroidism, unspecified: Secondary | ICD-10-CM

## 2018-08-07 LAB — TSH: TSH: 3.09 u[IU]/mL (ref 0.450–4.500)

## 2018-08-07 NOTE — Progress Notes (Signed)
   Labs Faxed to patient at [phone] Ordering Provider Malissa Hippo NP [facility] Endocrinology

## 2018-08-20 ENCOUNTER — Ambulatory Visit: Payer: Managed Care, Other (non HMO) | Admitting: Family Medicine

## 2018-08-20 ENCOUNTER — Ambulatory Visit: Payer: Self-pay | Admitting: Family Medicine

## 2018-08-20 VITALS — BP 138/88 | HR 103 | Temp 97.6°F | Resp 16 | Wt 169.0 lb

## 2018-08-20 DIAGNOSIS — R5383 Other fatigue: Secondary | ICD-10-CM

## 2018-08-20 DIAGNOSIS — E559 Vitamin D deficiency, unspecified: Secondary | ICD-10-CM | POA: Diagnosis not present

## 2018-08-20 DIAGNOSIS — E039 Hypothyroidism, unspecified: Secondary | ICD-10-CM

## 2018-08-20 DIAGNOSIS — I10 Essential (primary) hypertension: Secondary | ICD-10-CM

## 2018-08-20 DIAGNOSIS — G609 Hereditary and idiopathic neuropathy, unspecified: Secondary | ICD-10-CM

## 2018-08-20 DIAGNOSIS — E781 Pure hyperglyceridemia: Secondary | ICD-10-CM

## 2018-08-20 NOTE — Patient Instructions (Signed)
1.  Consider having Echocardiogram done or seeing the cardiologist.  2.  See sleep specialist with possible sleep study to evaluate for possible sleep apnea  3.  Consider re-starting Buproprion

## 2018-08-20 NOTE — Progress Notes (Signed)
Rachel Noble  MRN: [id number] DOB: 1956/04/04  Subjective:  HPI   The patient is a 63 year old female who presents today for follow up from visit 2 months ago.  She was last seen on 06/11/18 for follow up after seeing hematology and to follow up on Cymbalta.  At that time she noted increased feeling of tiredness since starting the Cymbalta.  Today the patient states that she has had fatigue for about 2 years.  She does note that it has progressively gotten worse.  She states that she stopped the Cymbalta and HRT prior to Christmas because she felt it was making her fatigue worse.  The patient states that her endocrinologist has increased her Armour therapy by 30 mg per week.  The patient has just started that 4 days ago.    Patient Active Problem List   Diagnosis Date Noted  . Hypertension 08/22/2016  . Hypertriglyceridemia 03/04/2014  . Hypothyroidism 12/03/2013  . Vitamin D deficiency 12/03/2013    Past Medical History:  Diagnosis Date  . Cancer ([place]    skin  . GERD (gastroesophageal reflux disease)   . Idiopathic neuropathy   . Thyroid disease     Social History   Socioeconomic History  . Marital status: Married    Spouse name: Not on file  . Number of children: Not on file  . Years of education: Not on file  . Highest education level: Not on file  Occupational History  . Not on file  Social Needs  . Financial resource strain: Not on file  . Food insecurity:    Worry: Not on file    Inability: Not on file  . Transportation needs:    Medical: Not on file    Non-medical: Not on file  Tobacco Use  . Smoking status: Never Smoker  . Smokeless tobacco: Never Used  Substance and Sexual Activity  . Alcohol use: No  . Drug use: No  . Sexual activity: Not on file  Lifestyle  . Physical activity:    Days per week: Not on file    Minutes per session: Not on file  . Stress: Not on file  Relationships  . Social connections:    Talks on phone: Not on file   Gets together: Not on file    Attends religious service: Not on file    Active member of club or organization: Not on file    Attends meetings of clubs or organizations: Not on file    Relationship status: Not on file  . Intimate partner violence:    Fear of current or ex partner: Not on file    Emotionally abused: Not on file    Physically abused: Not on file    Forced sexual activity: Not on file  Other Topics Concern  . Not on file  Social History Narrative  . Not on file    Outpatient Encounter Medications as of 08/20/2018  Medication Sig Note  . calcium citrate-vitamin D 500-400 MG-UNIT chewable tablet Chew by mouth daily.   . cetirizine (ZYRTEC) 10 MG tablet Take 10 mg by mouth daily.   . cyanocobalamin (,VITAMIN B-12,) 1000 MCG/ML injection Inject 1,000 mcg into the muscle every 30 (thirty) days.  07/18/2015: Received from: [facility]  . ferrous sulfate 325 (65 FE) MG tablet Take 325 mg by mouth daily.   [name] 1000 MG CAPS Take by mouth.   . magnesium oxide (MAG-OX) 400 MG tablet Take 400 mg  by mouth daily.   . nortriptyline (PAMELOR) 10 MG capsule Take 40 mg by mouth at bedtime.    . pantoprazole (PROTONIX) 40 MG tablet TAKE 1 TABLET BY MOUTH DAILY 30 MINUTES BEFORE A MEAL.   [name] thyroid (ARMOUR THYROID) 15 MG tablet Take by mouth. Patient takes 45 mg daily except 2 days a week she takes a 60 mg pill 07/18/2015: [facility]: [facility]  . Vitamin D, Ergocalciferol, (DRISDOL) 1.25 MG (50000 UT) CAPS capsule Take 50,000 Units by mouth once a week.   . DULoxetine (CYMBALTA) 30 MG capsule Take 1 capsule (30 mg total) by mouth daily. (Patient not taking: Reported on 08/20/2018)   . [DISCONTINUED] omeprazole (PRILOSEC) 40 MG capsule Take 1 capsule (40 mg total) by mouth once. (Patient not taking: Reported on 06/11/2018)    No facility-administered encounter medications on file as of 08/20/2018.     No Active Allergies  Review of Systems    Constitutional: Positive for malaise/fatigue. Negative for fever.  HENT: Negative.   Eyes: Negative.   Respiratory: Negative for cough, shortness of breath and wheezing.   Cardiovascular: Negative for chest pain, palpitations, orthopnea, claudication and leg swelling.  Gastrointestinal: Negative.   Skin: Negative.   Endo/Heme/Allergies: Negative.   Psychiatric/Behavioral: Negative.      Depression screen [facility] 2/9 08/20/2018 06/11/2018 03/18/2018  Decreased Interest 1 0 1  Down, Depressed, Hopeless 0 0 2  PHQ - 2 Score 1 0 3  Altered sleeping 0 - 3  Tired, decreased energy 3 - 3  Change in appetite 0 - 2  Feeling bad or failure about yourself  1 - 1  Trouble concentrating 1 - 2  Moving slowly or fidgety/restless 0 - 0  Suicidal thoughts 0 - 0  PHQ-9 Score 6 - 14  Difficult doing work/chores Somewhat difficult - Somewhat difficult   Results of the Epworth flowsheet 08/20/2018  Sitting and reading 2  Watching TV 2  Sitting, inactive in a public place (e.g. a theatre or a meeting) 0  As a passenger in a car for an hour without a break 1  Lying down to rest in the afternoon when circumstances permit 3  Sitting and talking to someone 0  Sitting quietly after a lunch without alcohol 3  In a car, while stopped for a few minutes in traffic 0  Total score 11      Objective:  BP 138/88 (BP Location: Right Arm, Patient Position: Sitting, Cuff Size: Normal)   Pulse (!) 103   Temp 97.6 F (36.4 C) (Oral)   Resp 16   Wt 169 lb (76.7 kg)   SpO2 99%   BMI 29.01 kg/m   Physical Exam  Constitutional: She is oriented to person, place, and time and well-developed, well-nourished, and in no distress.  HENT:  Head: Normocephalic and atraumatic.  Right Ear: External ear normal.  Left Ear: External ear normal.  Nose: Nose normal.  Eyes: Conjunctivae are normal. No scleral icterus.  Neck: No thyromegaly present.  Cardiovascular: Normal rate, normal heart sounds and intact distal pulses.   Pulmonary/Chest: Breath sounds normal.  Abdominal: Soft.  Musculoskeletal:        General: No edema.  Neurological: She is alert and oriented to person, place, and time. Gait normal. GCS score is 15.  Skin: Skin is warm and dry.  Psychiatric: Mood, memory, affect and judgment normal.    Assessment and Plan :  1. Fatigue, unspecified type Not sure about the etiology of  this fatigue.  There are multiple possibilities.  Cardiomyopathy, depression, sleep apnea, chronic anxiety,.  Will obtain PHQ 9 at work and EKG.  Consider referral to cardiology for work-up.  Start bupropion. - EKG 12-Lead More than 50% of this 30-minute visit is spent in counseling and coordination of care.  2. Essential hypertension   3. Acquired hypothyroidism Followed by endocrinology.  4. Vitamin D deficiency   5. Hypertriglyceridemia   6. Idiopathic peripheral neuropathy Consider further referral.  I have done the exam and reviewed the chart and it is accurate to the best of my knowledge. Development worker, community has been used and  any errors in dictation or transcription are unintentional. Miguel Aschoff M.D. [facility]

## 2018-08-21 ENCOUNTER — Telehealth: Payer: Self-pay | Admitting: Family Medicine

## 2018-08-21 MED ORDER — BUPROPION HCL ER (XL) 150 MG PO TB24: 150.0000 mg | ORAL_TABLET | Freq: Every day | ORAL | 12 refills | Status: DC

## 2018-08-21 NOTE — Telephone Encounter (Signed)
Pt asking Dr. Rosanna Randy to go ahead and fill the Wellbutrin for her at:  [place], [place] - [place] [phone] (Phone) [phone] (Fax)   Thanks, [employer]

## 2018-08-21 NOTE — Telephone Encounter (Signed)
150 xl daily

## 2018-08-21 NOTE — Telephone Encounter (Signed)
What strength do you want to start her on?

## 2018-09-30 ENCOUNTER — Ambulatory Visit
Admission: RE | Admit: 2018-09-30 | Discharge: 2018-09-30 | Disposition: A | Discharge status: Home / Self Care | Payer: Managed Care, Other (non HMO) | Source: Ambulatory Visit | Attending: Obstetrics and Gynecology | Admitting: Obstetrics and Gynecology

## 2018-09-30 DIAGNOSIS — Z1231 Encounter for screening mammogram for malignant neoplasm of breast: Secondary | ICD-10-CM | POA: Diagnosis present

## 2018-10-06 ENCOUNTER — Other Ambulatory Visit: Payer: Managed Care, Other (non HMO)

## 2018-10-06 ENCOUNTER — Other Ambulatory Visit: Payer: Self-pay

## 2018-10-06 DIAGNOSIS — E039 Hypothyroidism, unspecified: Secondary | ICD-10-CM | POA: Diagnosis not present

## 2018-10-06 NOTE — Progress Notes (Signed)
Ordering Provider not in epic LAB ORDER : [facility]  ORDERING PROVIDER : Drema Halon NP FAX RESULTS TO [phone]

## 2018-10-07 LAB — TSH: TSH: 3.72 u[IU]/mL (ref 0.450–4.500)

## 2018-11-20 NOTE — Addendum Note (Signed)
Addended by: Doreen Beam on: 11/20/2018 12:27 PM   Modules accepted: Level of Service

## 2018-11-20 NOTE — Addendum Note (Signed)
Addended by: Doreen Beam on: 11/20/2018 10:50 AM   Modules accepted: Level of Service

## 2018-12-04 ENCOUNTER — Other Ambulatory Visit: Payer: Managed Care, Other (non HMO)

## 2018-12-08 ENCOUNTER — Other Ambulatory Visit: Payer: Self-pay

## 2018-12-08 DIAGNOSIS — E559 Vitamin D deficiency, unspecified: Secondary | ICD-10-CM

## 2018-12-08 DIAGNOSIS — E039 Hypothyroidism, unspecified: Secondary | ICD-10-CM

## 2018-12-24 ENCOUNTER — Other Ambulatory Visit: Payer: Managed Care, Other (non HMO)

## 2018-12-24 ENCOUNTER — Other Ambulatory Visit: Payer: Self-pay

## 2018-12-29 ENCOUNTER — Other Ambulatory Visit: Payer: Managed Care, Other (non HMO)

## 2018-12-29 ENCOUNTER — Other Ambulatory Visit: Payer: Self-pay

## 2018-12-29 DIAGNOSIS — E039 Hypothyroidism, unspecified: Secondary | ICD-10-CM | POA: Diagnosis not present

## 2018-12-29 DIAGNOSIS — E559 Vitamin D deficiency, unspecified: Secondary | ICD-10-CM

## 2018-12-29 DIAGNOSIS — I10 Essential (primary) hypertension: Secondary | ICD-10-CM | POA: Diagnosis not present

## 2018-12-29 NOTE — Progress Notes (Signed)
Ordering Provider not in epic:  Orders: TSH, Vitamin D, Vitamin B12  Ordering Provider: Malissa Hippo FNP  Fax results to [phone]  Patient wants copy of labs mailed.  Jayvion Stefanski CMA

## 2018-12-30 LAB — VITAMIN D 25 HYDROXY (VIT D DEFICIENCY, FRACTURES): Vit D, 25-Hydroxy: 46.2 ng/mL (ref 30.0–100.0)

## 2018-12-30 LAB — TSH: TSH: 1.11 u[IU]/mL (ref 0.450–4.500)

## 2018-12-30 LAB — VITAMIN B12: Vitamin B-12: 1247 pg/mL — ABNORMAL HIGH (ref 232–1245)

## 2019-02-17 ENCOUNTER — Ambulatory Visit: Payer: Self-pay | Admitting: Family Medicine

## 2019-04-14 ENCOUNTER — Other Ambulatory Visit: Payer: Self-pay

## 2019-04-14 ENCOUNTER — Other Ambulatory Visit: Payer: Managed Care, Other (non HMO) | Admitting: Adult Health

## 2019-04-14 DIAGNOSIS — E039 Hypothyroidism, unspecified: Secondary | ICD-10-CM

## 2019-04-14 NOTE — Progress Notes (Signed)
Mail copy and fax to [place] [facility] and to personal fax at [phone].  Ordering Provider not in [place] form filled out.

## 2019-04-15 LAB — CBC WITH DIFFERENTIAL/PLATELET
Basophils Absolute: 0.1 10*3/uL (ref 0.0–0.2)
Basos: 1 %
EOS (ABSOLUTE): 0.1 10*3/uL (ref 0.0–0.4)
Eos: 1 %
Hematocrit: 41.4 % (ref 34.0–46.6)
Hemoglobin: 14.3 g/dL (ref 11.1–15.9)
Immature Grans (Abs): 0 10*3/uL (ref 0.0–0.1)
Immature Granulocytes: 0 %
Lymphocytes Absolute: 5.5 10*3/uL — ABNORMAL HIGH (ref 0.7–3.1)
Lymphs: 45 %
MCH: 28.9 pg (ref 26.6–33.0)
MCHC: 34.5 g/dL (ref 31.5–35.7)
MCV: 84 fL (ref 79–97)
Monocytes Absolute: 0.8 10*3/uL (ref 0.1–0.9)
Monocytes: 6 %
Neutrophils Absolute: 5.8 10*3/uL (ref 1.4–7.0)
Neutrophils: 47 %
Platelets: 388 10*3/uL (ref 150–450)
RBC: 4.94 x10E6/uL (ref 3.77–5.28)
RDW: 12.6 % (ref 11.7–15.4)
WBC: 12.3 10*3/uL — ABNORMAL HIGH (ref 3.4–10.8)

## 2019-04-15 LAB — TSH: TSH: 3.06 u[IU]/mL (ref 0.450–4.500)

## 2019-04-15 LAB — VITAMIN B12: Vitamin B-12: >2000 pg/mL — ABNORMAL HIGH (ref 232–1245)

## 2019-04-15 LAB — VITAMIN D 25 HYDROXY (VIT D DEFICIENCY, FRACTURES): Vit D, 25-Hydroxy: 43.6 ng/mL (ref 30.0–100.0)

## 2019-06-08 ENCOUNTER — Other Ambulatory Visit: Payer: Self-pay

## 2019-06-08 DIAGNOSIS — D72820 Lymphocytosis (symptomatic): Secondary | ICD-10-CM

## 2019-06-09 ENCOUNTER — Encounter: Payer: Self-pay | Admitting: Oncology

## 2019-06-09 ENCOUNTER — Other Ambulatory Visit: Payer: Self-pay

## 2019-06-09 ENCOUNTER — Inpatient Hospital Stay: Payer: Managed Care, Other (non HMO) | Admitting: Oncology

## 2019-06-09 ENCOUNTER — Inpatient Hospital Stay: Payer: Managed Care, Other (non HMO) | Attending: Oncology

## 2019-06-09 VITALS — BP 144/81 | HR 99 | Temp 98.1°F | Resp 16 | Wt 163.6 lb

## 2019-06-09 DIAGNOSIS — D72820 Lymphocytosis (symptomatic): Secondary | ICD-10-CM | POA: Diagnosis not present

## 2019-06-09 DIAGNOSIS — Z79899 Other long term (current) drug therapy: Secondary | ICD-10-CM | POA: Diagnosis not present

## 2019-06-09 DIAGNOSIS — G609 Hereditary and idiopathic neuropathy, unspecified: Secondary | ICD-10-CM | POA: Diagnosis not present

## 2019-06-09 DIAGNOSIS — E079 Disorder of thyroid, unspecified: Secondary | ICD-10-CM | POA: Insufficient documentation

## 2019-06-09 DIAGNOSIS — R5383 Other fatigue: Secondary | ICD-10-CM | POA: Diagnosis not present

## 2019-06-09 DIAGNOSIS — D72829 Elevated white blood cell count, unspecified: Secondary | ICD-10-CM | POA: Diagnosis not present

## 2019-06-09 DIAGNOSIS — R232 Flushing: Secondary | ICD-10-CM | POA: Diagnosis not present

## 2019-06-09 LAB — CBC WITH DIFFERENTIAL/PLATELET
Abs Immature Granulocytes: 0.03 10*3/uL (ref 0.00–0.07)
Basophils Absolute: 0.1 10*3/uL (ref 0.0–0.1)
Basophils Relative: 1 %
Eosinophils Absolute: 0.1 10*3/uL (ref 0.0–0.5)
Eosinophils Relative: 1 %
HCT: 41.2 % (ref 36.0–46.0)
Hemoglobin: 13.4 g/dL (ref 12.0–15.0)
Immature Granulocytes: 0 %
Lymphocytes Relative: 47 %
Lymphs Abs: 5.7 10*3/uL — ABNORMAL HIGH (ref 0.7–4.0)
MCH: 28.4 pg (ref 26.0–34.0)
MCHC: 32.5 g/dL (ref 30.0–36.0)
MCV: 87.3 fL (ref 80.0–100.0)
Monocytes Absolute: 0.7 10*3/uL (ref 0.1–1.0)
Monocytes Relative: 6 %
Neutro Abs: 5.5 10*3/uL (ref 1.7–7.7)
Neutrophils Relative %: 45 %
Platelets: 354 10*3/uL (ref 150–400)
RBC: 4.72 MIL/uL (ref 3.87–5.11)
RDW: 12.7 % (ref 11.5–15.5)
WBC: 12.2 10*3/uL — ABNORMAL HIGH (ref 4.0–10.5)
nRBC: 0 % (ref 0.0–0.2)

## 2019-06-09 NOTE — Progress Notes (Signed)
Feeling of hot flashes for 1.5 yrs.

## 2019-06-09 NOTE — Progress Notes (Signed)
Hematology/Oncology follow up note [facility] Telephone:([phone]) [phone] [phone]   Patient Care Team: [name], MD as PCP - General (Family Medicine)  REFERRING PROVIDER: [name], MD  CHIEF COMPLAINTS/REASON FOR VISIT:  Evaluation of leukocytosis  HISTORY OF PRESENTING ILLNESS:  Rachel Noble is a  63 y.o.  female with PMH listed below who was referred to me for evaluation of leukocytosis Reviewed patient' recent labs obtained by PCP.  CBC showed elevated white count of 12.4, predominately lymphocytosis,  Previous lab records reviewed. Leukocytosis onset of chronic, duration is since at least 11/12/2017.  No aggravating or elevated factors. Associated symptoms or signs:  Denies weight loss, fever, chills,night sweats.  Feeling chronic Fatigue Smoking history: denies History of recent oral steroid use or steroid injection: denies History of recent infection: deneis Autoimmune disease history.  Denies Denies any chronic unhealed wound or infection.  On chronic hormone replacements with estradiol 1mg  daily.   INTERVAL HISTORY Rachel Noble is a 63 y.o. female who has above history reviewed by me today presents for follow up visit for management of leukocytosis Patient was accompanied by her husband today.  Was Last seen by me 1 year ago. Denies any new complaints. Thyroid medication has been adjusted and her fatigue level has improved. Denies weight loss, fever, chills, fatigue, night sweats.  Continues to have lower extremity neuropathy, follows up with neurology.  She has come off hormone replacement.  Has been having hot flashes.  Manageable.  Review of Systems  Constitutional: Negative for chills, fever, malaise/fatigue and weight loss.       Hot flash  HENT: Negative for nosebleeds and sore throat.   Eyes: Negative for double vision, photophobia and redness.  Respiratory: Negative for cough,  shortness of breath and wheezing.   Cardiovascular: Negative for chest pain, palpitations, orthopnea and leg swelling.  Gastrointestinal: Negative for abdominal pain, blood in stool, nausea and vomiting.  Genitourinary: Negative for dysuria.  Musculoskeletal: Negative for back pain, myalgias and neck pain.  Skin: Negative for itching and rash.  Neurological: Positive for tingling. Negative for dizziness and tremors.  Endo/Heme/Allergies: Negative for environmental allergies. Does not bruise/bleed easily.  Psychiatric/Behavioral: Negative for depression and hallucinations.    MEDICAL HISTORY:  Past Medical History:  Diagnosis Date  . Cancer ([place])    skin  . GERD (gastroesophageal reflux disease)   . Idiopathic neuropathy   . Thyroid disease     SURGICAL HISTORY: Past Surgical History:  Procedure Laterality Date  . ABDOMINAL HYSTERECTOMY    . bowel blockage    . CESAREAN SECTION     x 2  . CHOLECYSTECTOMY    . ELBOW SURGERY Right   . HERNIA REPAIR    . LASIK    . PLANTAR FASCIA SURGERY Right     SOCIAL HISTORY: Social History   Socioeconomic History  . Marital status: Married    Spouse name: Not on file  . Number of children: Not on file  . Years of education: Not on file  . Highest education level: Not on file  Occupational History  . Not on file  Social Needs  . Financial resource strain: Not on file  . Food insecurity    Worry: Not on file    Inability: Not on file  . Transportation needs    Medical: Not on file    Non-medical: Not on file  Tobacco Use  . Smoking status: Never Smoker  . Smokeless tobacco:  Never Used  Substance and Sexual Activity  . Alcohol use: No  . Drug use: No  . Sexual activity: Not on file  Lifestyle  . Physical activity    Days per week: Not on file    Minutes per session: Not on file  . Stress: Not on file  Relationships  . Social Herbalist on phone: Not on file    Gets together: Not on file    Attends  religious service: Not on file    Active member of club or organization: Not on file    Attends meetings of clubs or organizations: Not on file    Relationship status: Not on file  . Intimate partner violence    Fear of current or ex partner: Not on file    Emotionally abused: Not on file    Physically abused: Not on file    Forced sexual activity: Not on file  Other Topics Concern  . Not on file  Social History Narrative  . Not on file    FAMILY HISTORY: Family History  Adopted: Yes  Problem Relation Age of Onset  . Breast cancer Neg Hx     ALLERGIES:  has no active allergies.  MEDICATIONS:  Current Outpatient Medications  Medication Sig Dispense Refill  . ARMOUR THYROID 60 MG tablet     . calcium citrate-vitamin D 500-400 MG-UNIT chewable tablet Chew by mouth daily.    . cetirizine (ZYRTEC) 10 MG tablet Take 10 mg by mouth daily.    . cyanocobalamin (,VITAMIN B-12,) 1000 MCG/ML injection Inject 1,000 mcg into the muscle every 30 (thirty) days.     . ferrous sulfate 325 (65 FE) MG tablet Take 325 mg by mouth daily.    [name] 1000 MG CAPS Take by mouth.    . magnesium oxide (MAG-OX) 400 MG tablet Take 400 mg by mouth daily.    . nortriptyline (PAMELOR) 10 MG capsule Take 40 mg by mouth at bedtime.     [name] omeprazole (PRILOSEC) 40 MG capsule Take by mouth.    . Vitamin D, Ergocalciferol, (DRISDOL) 1.25 MG (50000 UT) CAPS capsule Take 50,000 Units by mouth once a week.    [name] buPROPion (WELLBUTRIN XL) 150 MG 24 hr tablet Take 1 tablet (150 mg total) by mouth daily. (Patient not taking: Reported on 06/09/2019) 30 tablet 12  . DEXILANT 60 MG capsule     . TURMERIC PO Take by mouth.     No current facility-administered medications for this visit.      PHYSICAL EXAMINATION: ECOG PERFORMANCE STATUS: 0 - Asymptomatic Vitals:   06/09/19 1332  BP: (!) 144/81  Pulse: 99  Resp: 16  Temp: 98.1 F (36.7 C)  SpO2: 99%   Filed Weights   06/09/19 1332  Weight: 163 lb 9.6 oz  (74.2 kg)    Physical Exam Constitutional:      General: She is not in acute distress. HENT:     Head: Normocephalic and atraumatic.  Eyes:     General: No scleral icterus.    Pupils: Pupils are equal, round, and reactive to light.  Neck:     Musculoskeletal: Normal range of motion and neck supple.  Cardiovascular:     Rate and Rhythm: Normal rate and regular rhythm.     Heart sounds: Normal heart sounds.  Pulmonary:     Effort: Pulmonary effort is normal. No respiratory distress.     Breath sounds: No wheezing.  Abdominal:  General: Bowel sounds are normal. There is no distension.     Palpations: Abdomen is soft. There is no mass.     Tenderness: There is no abdominal tenderness.  Musculoskeletal: Normal range of motion.        General: No deformity.  Skin:    General: Skin is warm and dry.     Findings: No erythema or rash.  Neurological:     Mental Status: She is alert and oriented to person, place, and time.     Cranial Nerves: No cranial nerve deficit.     Coordination: Coordination normal.  Psychiatric:        Behavior: Behavior normal.        Thought Content: Thought content normal.     CMP Latest Ref Rng & Units 03/18/2018  Glucose 65 - 99 mg/dL 116(H)  BUN 8 - 27 mg/dL 10  Creatinine 0.57 - 1.00 mg/dL 0.77  Sodium 134 - 144 mmol/L 139  Potassium 3.5 - 5.2 mmol/L 4.6  Chloride 96 - 106 mmol/L 99  CO2 20 - 29 mmol/L 24  Calcium 8.7 - 10.3 mg/dL 9.8  Total Protein 6.0 - 8.5 g/dL 6.7  Total Bilirubin 0.0 - 1.2 mg/dL <0.2  Alkaline Phos 39 - 117 IU/L 92  AST 0 - 40 IU/L 16  ALT 0 - 32 IU/L 11   CBC Latest Ref Rng & Units 06/09/2019  WBC 4.0 - 10.5 K/uL 12.2(H)  Hemoglobin 12.0 - 15.0 g/dL 13.4  Hematocrit 36.0 - 46.0 % 41.2  Platelets 150 - 400 K/uL 354   RADIOGRAPHIC STUDIES: I have personally reviewed the radiological images as listed and agreed with the findings in the report. 09/17/2017 Mammogram No mammographic evidence of malignancy   LABORATORY DATA:  I have reviewed the data as listed Lab Results  Component Value Date   WBC 12.2 (H) 06/09/2019   HGB 13.4 06/09/2019   HCT 41.2 06/09/2019   MCV 87.3 06/09/2019   PLT 354 06/09/2019   No results for input(s): NA, K, CL, CO2, GLUCOSE, BUN, CREATININE, CALCIUM, GFRNONAA, GFRAA, PROT, ALBUMIN, AST, ALT, ALKPHOS, BILITOT, BILIDIR, IBILI in the last 8760 hours. Iron/TIBC/Ferritin/ %Sat    Component Value Date/Time   IRON 77 11/12/2016 0800    RADIOGRAPHIC STUDIES: I have personally reviewed the radiological images as listed and agreed with the findings in the report. 09/17/2017 screening mammogram bilateral no mammographic evidence of malignancy.   ASSESSMENT & PLAN:  1. Lymphocytosis   2. Idiopathic neuropathy    #Labs are reviewed and discussed with patient. Chronic leukocytosis predominantly lymphocytosis stable. Patient continues to do well clinically.  No constitutional symptoms. Fatigue level has improved after starting the medication was adjusted. Discussed with patient and husband, given the chronicity and the mild level of leukocytosis/lymphocytosis, most likely this is reactive.  I recommend observation. Patient can follow-up with primary care provider.  If she develops additional symptoms or counts rapidly trending up, I will be happy to see her again for additional work-up. Patient and husband agree with the plan. Idiopathic neuropathy, continue follow-up with neurology.  Earlie Server, MD, PhD Hematology Oncology [facility] Pager- [phone] 06/09/2019

## 2019-06-10 ENCOUNTER — Ambulatory Visit: Payer: Self-pay | Admitting: Oncology

## 2019-06-10 ENCOUNTER — Other Ambulatory Visit: Payer: Self-pay

## 2019-06-24 ENCOUNTER — Encounter: Payer: Self-pay | Admitting: Oncology

## 2019-07-27 ENCOUNTER — Ambulatory Visit: Payer: Managed Care, Other (non HMO) | Attending: Internal Medicine

## 2019-07-27 DIAGNOSIS — Z20822 Contact with and (suspected) exposure to covid-19: Secondary | ICD-10-CM

## 2019-07-29 LAB — NOVEL CORONAVIRUS, NAA: SARS-CoV-2, NAA: NOT DETECTED

## 2019-08-05 ENCOUNTER — Ambulatory Visit: Payer: Managed Care, Other (non HMO) | Attending: Internal Medicine

## 2019-08-05 DIAGNOSIS — Z20822 Contact with and (suspected) exposure to covid-19: Secondary | ICD-10-CM

## 2019-08-07 LAB — NOVEL CORONAVIRUS, NAA: SARS-CoV-2, NAA: NOT DETECTED

## 2019-10-05 ENCOUNTER — Ambulatory Visit: Payer: Managed Care, Other (non HMO) | Attending: Internal Medicine

## 2019-10-05 DIAGNOSIS — Z20822 Contact with and (suspected) exposure to covid-19: Secondary | ICD-10-CM

## 2019-10-06 LAB — NOVEL CORONAVIRUS, NAA: SARS-CoV-2, NAA: NOT DETECTED

## 2019-10-22 ENCOUNTER — Ambulatory Visit: Payer: Managed Care, Other (non HMO) | Attending: Internal Medicine

## 2019-10-22 DIAGNOSIS — Z20822 Contact with and (suspected) exposure to covid-19: Secondary | ICD-10-CM

## 2019-10-23 LAB — SARS-COV-2, NAA 2 DAY TAT

## 2019-10-23 LAB — NOVEL CORONAVIRUS, NAA: SARS-CoV-2, NAA: NOT DETECTED

## 2019-12-25 ENCOUNTER — Other Ambulatory Visit: Payer: Managed Care, Other (non HMO)

## 2019-12-29 ENCOUNTER — Other Ambulatory Visit: Payer: Managed Care, Other (non HMO)

## 2019-12-29 ENCOUNTER — Other Ambulatory Visit: Payer: Self-pay

## 2019-12-29 DIAGNOSIS — E039 Hypothyroidism, unspecified: Secondary | ICD-10-CM

## 2019-12-29 DIAGNOSIS — R5383 Other fatigue: Secondary | ICD-10-CM | POA: Diagnosis not present

## 2019-12-30 LAB — CBC WITH DIFFERENTIAL/PLATELET
Basophils Absolute: 0.1 10*3/uL (ref 0.0–0.2)
Basos: 1 %
EOS (ABSOLUTE): 0.1 10*3/uL (ref 0.0–0.4)
Eos: 1 %
Hematocrit: 40.8 % (ref 34.0–46.6)
Hemoglobin: 14.1 g/dL (ref 11.1–15.9)
Immature Grans (Abs): 0 10*3/uL (ref 0.0–0.1)
Immature Granulocytes: 0 %
Lymphocytes Absolute: 5.7 10*3/uL — ABNORMAL HIGH (ref 0.7–3.1)
Lymphs: 46 %
MCH: 29.4 pg (ref 26.6–33.0)
MCHC: 34.6 g/dL (ref 31.5–35.7)
MCV: 85 fL (ref 79–97)
Monocytes Absolute: 0.6 10*3/uL (ref 0.1–0.9)
Monocytes: 5 %
Neutrophils Absolute: 5.7 10*3/uL (ref 1.4–7.0)
Neutrophils: 47 %
Platelets: 341 10*3/uL (ref 150–450)
RBC: 4.79 x10E6/uL (ref 3.77–5.28)
RDW: 12.6 % (ref 11.7–15.4)
WBC: 12.3 10*3/uL — ABNORMAL HIGH (ref 3.4–10.8)

## 2019-12-30 LAB — T3, FREE: T3, Free: 3.8 pg/mL (ref 2.0–4.4)

## 2019-12-30 LAB — T4, FREE: Free T4: 0.8 ng/dL — ABNORMAL LOW (ref 0.82–1.77)

## 2019-12-30 LAB — TSH: TSH: 2.11 u[IU]/mL (ref 0.450–4.500)

## 2020-01-14 ENCOUNTER — Encounter: Payer: Self-pay | Admitting: Oncology

## 2020-01-22 ENCOUNTER — Other Ambulatory Visit: Payer: Self-pay

## 2020-01-22 DIAGNOSIS — N6320 Unspecified lump in the left breast, unspecified quadrant: Secondary | ICD-10-CM

## 2020-01-22 DIAGNOSIS — R599 Enlarged lymph nodes, unspecified: Secondary | ICD-10-CM

## 2020-01-29 ENCOUNTER — Ambulatory Visit
Admission: RE | Admit: 2020-01-29 | Discharge: 2020-01-29 | Disposition: A | Discharge status: Home / Self Care | Payer: Managed Care, Other (non HMO) | Source: Ambulatory Visit

## 2020-01-29 DIAGNOSIS — N6320 Unspecified lump in the left breast, unspecified quadrant: Secondary | ICD-10-CM

## 2020-01-29 DIAGNOSIS — R599 Enlarged lymph nodes, unspecified: Secondary | ICD-10-CM | POA: Insufficient documentation

## 2020-01-29 DIAGNOSIS — N6310 Unspecified lump in the right breast, unspecified quadrant: Secondary | ICD-10-CM | POA: Diagnosis present

## 2020-02-15 NOTE — Progress Notes (Signed)
Trena Platt Cummings,acting as a scribe for Wilhemena Durie, MD.,have documented all relevant documentation on the behalf of Wilhemena Durie, MD,as directed by  Wilhemena Durie, MD while in the presence of Wilhemena Durie, MD.  Established patient visit   Patient: Rachel Noble   DOB: Jun 29, 1956   64 y.o. Female  MRN: [id number] Visit Date: 02/16/2020  Today's healthcare provider: Wilhemena Durie, MD   Chief Complaint  Patient presents with  . Flank Pain   Subjective    Flank Pain This is a recurrent problem. The current episode started more than 1 year ago. The problem occurs constantly. The quality of the pain is described as aching. The pain does not radiate. The pain is at a severity of 3/10. The pain is the same all the time. The symptoms are aggravated by coughing, lying down and twisting. Pertinent negatives include no abdominal pain, chest pain, dysuria, fever, headaches, numbness, pelvic pain, tingling or weakness. She has tried nothing for the symptoms.     Patient says that she has mentioned this to pcp before.  Patient has seen multiple specialist since her last visit here a year and a half ago.  She has no respiratory symptoms no chest pain, no trauma.  No GI or GU symptoms.  No weight loss or systemic symptoms.      Medications: Outpatient Medications Prior to Visit  Medication Sig  . ARMOUR THYROID 60 MG tablet   . calcium citrate-vitamin D 500-400 MG-UNIT chewable tablet Chew by mouth daily.  . cetirizine (ZYRTEC) 10 MG tablet Take 10 mg by mouth daily.  . cyanocobalamin (,VITAMIN B-12,) 1000 MCG/ML injection Inject 1,000 mcg into the muscle every 30 (thirty) days.   . ferrous sulfate 325 (65 FE) MG tablet Take 325 mg by mouth daily.  . magnesium oxide (MAG-OX) 400 MG tablet Take 400 mg by mouth daily.  [name] omeprazole (PRILOSEC) 40 MG capsule Take by mouth.  . TURMERIC PO Take by mouth.  . Vitamin D, Ergocalciferol, (DRISDOL) 1.25 MG (50000 UT)  CAPS capsule Take 50,000 Units by mouth once a week.  [name] buPROPion (WELLBUTRIN XL) 150 MG 24 hr tablet Take 1 tablet (150 mg total) by mouth daily. (Patient not taking: Reported on 06/09/2019)  . DEXILANT 60 MG capsule  (Patient not taking: Reported on 02/16/2020)  . Krill Oil 1000 MG CAPS Take by mouth. (Patient not taking: Reported on 02/16/2020)  . nortriptyline (PAMELOR) 10 MG capsule Take 40 mg by mouth at bedtime.  (Patient not taking: Reported on 02/16/2020)   No facility-administered medications prior to visit.    Review of Systems  Constitutional: Negative for appetite change, chills, fatigue and fever.  Respiratory: Negative for chest tightness and shortness of breath.   Cardiovascular: Negative for chest pain and palpitations.  Gastrointestinal: Negative for abdominal pain, nausea and vomiting.  Genitourinary: Positive for flank pain. Negative for dysuria and pelvic pain.  Neurological: Negative for dizziness, tingling, weakness, numbness and headaches.       Objective    BP 126/80 (BP Location: Right Arm, Patient Position: Sitting, Cuff Size: Large)   Pulse 88   Temp (!) 97.5 F (36.4 C) (Temporal)   Ht 5\' 4"  (1.626 m)   Wt 172 lb 9.6 oz (78.3 kg)   BMI 29.63 kg/m  Wt Readings from Last 3 Encounters:  02/16/20 172 lb 9.6 oz (78.3 kg)  06/09/19 163 lb 9.6 oz (74.2 kg)  08/20/18 169 lb (76.7 kg)  Physical Exam Vitals reviewed.  Constitutional:      Appearance: She is well-developed.  HENT:     Head: Normocephalic and atraumatic.     Right Ear: External ear normal.     Left Ear: External ear normal.     Nose: Nose normal.  Eyes:     General: No scleral icterus.    Conjunctiva/sclera: Conjunctivae normal.  Neck:     Thyroid: No thyromegaly.  Cardiovascular:     Rate and Rhythm: Normal rate and regular rhythm.     Heart sounds: Normal heart sounds.  Pulmonary:     Effort: Pulmonary effort is normal.     Breath sounds: Normal breath sounds.  Chest:      Comments: Mild chest wall tenderness in the right lower lateral ribs which reproduces the pain she is having. Abdominal:     Palpations: Abdomen is soft.  Lymphadenopathy:     Cervical: No cervical adenopathy.  Skin:    General: Skin is warm and dry.  Neurological:     Mental Status: She is alert and oriented to person, place, and time.  Psychiatric:        Behavior: Behavior normal.        Thought Content: Thought content normal.        Judgment: Judgment normal.       No results found for any visits on 02/16/20.  Assessment & Plan     1. Right upper quadrant abdominal pain Made referral back to GI or possibly CT scan.  Start with ultrasound.  Again, she may need CT of chest and abdomen. - US Abdomen Limited RUQ - CBC with Differential/Platelet - Comprehensive metabolic panel - Lipase - ketorolac (TORADOL) injection 60 mg - POCT urinalysis dipstick  2. Right-sided chest wall pain   3. Essential hypertension Blood pressure under good control.  4. Hypothyroidism, acquired Followed by endocrinology using Armour Thyroid  5. Neuropathic pain of both feet She has a history of this problem.  6. Vitamin D deficiency   7. Anxiety Chronic ongoing issue for this patient.   No follow-ups on file.         Kristy Catoe Cranford Mon, MD  [facility] [phone] (phone) [phone] (fax)  [place]

## 2020-02-16 ENCOUNTER — Other Ambulatory Visit: Payer: Self-pay

## 2020-02-16 ENCOUNTER — Other Ambulatory Visit: Payer: Managed Care, Other (non HMO)

## 2020-02-16 ENCOUNTER — Ambulatory Visit: Payer: Managed Care, Other (non HMO) | Admitting: Family Medicine

## 2020-02-16 ENCOUNTER — Encounter: Payer: Self-pay | Admitting: Family Medicine

## 2020-02-16 VITALS — BP 126/80 | HR 88 | Temp 97.5°F | Ht 64.0 in | Wt 172.6 lb

## 2020-02-16 DIAGNOSIS — R1011 Right upper quadrant pain: Secondary | ICD-10-CM

## 2020-02-16 DIAGNOSIS — E559 Vitamin D deficiency, unspecified: Secondary | ICD-10-CM

## 2020-02-16 DIAGNOSIS — I10 Essential (primary) hypertension: Secondary | ICD-10-CM | POA: Diagnosis not present

## 2020-02-16 DIAGNOSIS — R0789 Other chest pain: Secondary | ICD-10-CM | POA: Diagnosis not present

## 2020-02-16 DIAGNOSIS — R5383 Other fatigue: Secondary | ICD-10-CM | POA: Diagnosis not present

## 2020-02-16 DIAGNOSIS — E039 Hypothyroidism, unspecified: Secondary | ICD-10-CM

## 2020-02-16 DIAGNOSIS — F419 Anxiety disorder, unspecified: Secondary | ICD-10-CM

## 2020-02-16 DIAGNOSIS — G5793 Unspecified mononeuropathy of bilateral lower limbs: Secondary | ICD-10-CM

## 2020-02-16 LAB — POCT URINALYSIS DIPSTICK
Bilirubin, UA: NEGATIVE
Blood, UA: NEGATIVE
Glucose, UA: NEGATIVE
Ketones, UA: NEGATIVE
Leukocytes, UA: NEGATIVE
Nitrite, UA: NEGATIVE
Protein, UA: NEGATIVE
Spec Grav, UA: 1.015 (ref 1.010–1.025)
Urobilinogen, UA: 0.2 E.U./dL
pH, UA: 5 (ref 5.0–8.0)

## 2020-02-16 MED ORDER — KETOROLAC TROMETHAMINE 60 MG/2ML IM SOLN
60.0000 mg | Freq: Once | INTRAMUSCULAR | Status: AC
  Administered 2020-02-16: 60 mg via INTRAMUSCULAR

## 2020-02-16 NOTE — Addendum Note (Signed)
Addended by: Jay Schlichter D on: 02/16/2020 03:05 PM   Modules accepted: Orders

## 2020-02-17 LAB — CBC WITH DIFFERENTIAL/PLATELET
Basophils Absolute: 0.1 10*3/uL (ref 0.0–0.2)
Basos: 1 %
EOS (ABSOLUTE): 0.1 10*3/uL (ref 0.0–0.4)
Eos: 1 %
Hematocrit: 40.4 % (ref 34.0–46.6)
Hemoglobin: 14.2 g/dL (ref 11.1–15.9)
Immature Grans (Abs): 0 10*3/uL (ref 0.0–0.1)
Immature Granulocytes: 0 %
Lymphocytes Absolute: 5.5 10*3/uL — ABNORMAL HIGH (ref 0.7–3.1)
Lymphs: 47 %
MCH: 29.2 pg (ref 26.6–33.0)
MCHC: 35.1 g/dL (ref 31.5–35.7)
MCV: 83 fL (ref 79–97)
Monocytes Absolute: 0.8 10*3/uL (ref 0.1–0.9)
Monocytes: 7 %
Neutrophils Absolute: 5.1 10*3/uL (ref 1.4–7.0)
Neutrophils: 44 %
Platelets: 350 10*3/uL (ref 150–450)
RBC: 4.87 x10E6/uL (ref 3.77–5.28)
RDW: 12.9 % (ref 11.7–15.4)
WBC: 11.7 10*3/uL — ABNORMAL HIGH (ref 3.4–10.8)

## 2020-02-17 LAB — COMPREHENSIVE METABOLIC PANEL
ALT: 24 IU/L (ref 0–32)
AST: 23 IU/L (ref 0–40)
Albumin/Globulin Ratio: 1.9 (ref 1.2–2.2)
Albumin: 4.6 g/dL (ref 3.8–4.8)
Alkaline Phosphatase: 125 IU/L — ABNORMAL HIGH (ref 48–121)
BUN/Creatinine Ratio: 20 (ref 12–28)
BUN: 16 mg/dL (ref 8–27)
Bilirubin Total: 0.2 mg/dL (ref 0.0–1.2)
CO2: 25 mmol/L (ref 20–29)
Calcium: 9.8 mg/dL (ref 8.7–10.3)
Chloride: 103 mmol/L (ref 96–106)
Creatinine, Ser: 0.81 mg/dL (ref 0.57–1.00)
GFR calc Af Amer: 90 mL/min/{1.73_m2} (ref >59)
GFR calc non Af Amer: 78 mL/min/{1.73_m2} (ref >59)
Globulin, Total: 2.4 g/dL (ref 1.5–4.5)
Glucose: 88 mg/dL (ref 65–99)
Potassium: 5.2 mmol/L (ref 3.5–5.2)
Sodium: 142 mmol/L (ref 134–144)
Total Protein: 7 g/dL (ref 6.0–8.5)

## 2020-02-17 LAB — LIPASE: Lipase: 23 U/L (ref 14–72)

## 2020-02-17 LAB — VITAMIN B12: Vitamin B-12: 1864 pg/mL — ABNORMAL HIGH (ref 232–1245)

## 2020-02-18 ENCOUNTER — Telehealth: Payer: Self-pay

## 2020-02-18 NOTE — Telephone Encounter (Signed)
Patient advised of lab results, ultrasound was scheduled for Monday. Patient wanted to schedule an appointment to come in next Thursday to discuss her results.

## 2020-02-18 NOTE — Telephone Encounter (Signed)
-----   Message from [name], MD sent at 02/17/2020  3:54 PM EDT ----- Labs all in normal range.

## 2020-02-19 ENCOUNTER — Other Ambulatory Visit: Payer: Self-pay | Admitting: Family Medicine

## 2020-02-19 MED ORDER — OMEPRAZOLE 40 MG PO CPDR: 40.0000 mg | DELAYED_RELEASE_CAPSULE | Freq: Every day | ORAL | 0 refills | Status: DC

## 2020-02-19 NOTE — Telephone Encounter (Signed)
°  Future visit scheduled: yes  Notes to clinic:  filled by a historical provider Please review for use and refill   Requested Prescriptions  Pending Prescriptions Disp Refills   omeprazole (PRILOSEC) 40 MG capsule      Sig: Take by mouth.      Gastroenterology: Proton Pump Inhibitors Passed - 02/19/2020 11:34 AM      Passed - Valid encounter within last 12 months    Recent Outpatient Visits           3 days ago Right upper quadrant abdominal pain   [facility] [name], MD   1 year ago Fatigue, unspecified type   [facility] [name], MD   1 year ago Other fatigue   [facility] [name], MD   1 year ago Fatigue due to [place] [name], MD   1 year ago Leukocytosis, unspecified type   [facility] [name], MD       Future Appointments             In 6 days [name], MD [facility] [place]

## 2020-02-19 NOTE — Addendum Note (Signed)
Addended by: Jefferson Fuel on: 02/19/2020 11:34 AM   Modules accepted: Orders

## 2020-02-19 NOTE — Telephone Encounter (Signed)
Please advise refill? Medication has never been filled by provider.

## 2020-02-19 NOTE — Telephone Encounter (Signed)
Copied from [place] [id number]. Topic: Quick Communication - Rx Refill/Question >> Feb 19, 2020 11:31 AM Leward Quan A wrote: Medication: omeprazole (PRILOSEC) 40 MG capsule  Has the patient contacted their pharmacy? Yes.   (Agent: If no, request that the patient contact the pharmacy for the refill.) (Agent: If yes, when and what did the pharmacy advise?)  Preferred Pharmacy (with phone number or street name): [facility], [place]  Phone:  [phone] Fax:  [phone]     Agent: Please be advised that RX refills may take up to 3 business days. We ask that you follow-up with your pharmacy.

## 2020-02-19 NOTE — Telephone Encounter (Signed)
[facility] faxed refill request for the following medications:  omeprazole (PRILOSEC) 40 MG capsule    Please advise.  Thanks, [employer]

## 2020-02-22 ENCOUNTER — Other Ambulatory Visit: Payer: Self-pay

## 2020-02-22 ENCOUNTER — Ambulatory Visit
Admission: RE | Admit: 2020-02-22 | Discharge: 2020-02-22 | Disposition: A | Discharge status: Home / Self Care | Payer: Managed Care, Other (non HMO) | Source: Ambulatory Visit | Attending: Family Medicine | Admitting: Family Medicine

## 2020-02-22 DIAGNOSIS — R1011 Right upper quadrant pain: Secondary | ICD-10-CM | POA: Diagnosis present

## 2020-02-23 ENCOUNTER — Other Ambulatory Visit: Payer: Managed Care, Other (non HMO)

## 2020-02-23 DIAGNOSIS — R5383 Other fatigue: Secondary | ICD-10-CM | POA: Diagnosis not present

## 2020-02-24 LAB — CORTISOL: Cortisol: 11.4 ug/dL

## 2020-02-24 NOTE — Progress Notes (Signed)
Established patient visit  I,April Miller,acting as a scribe for Wilhemena Durie, MD.,have documented all relevant documentation on the behalf of Wilhemena Durie, MD,as directed by  Wilhemena Durie, MD while in the presence of Wilhemena Durie, MD.   Patient: Rachel Noble   DOB: [birth year]   64 y.o. Female  MRN: [id number] Visit Date: 02/25/2020  Today's healthcare provider: Wilhemena Durie, MD   Chief Complaint  Patient presents with  . Follow-up   Subjective    HPI  With certain movements patient continues to have pain in the right lower lateral and anterior ribs.  Twisting movements bother her.  No GI symptoms.  No postprandial pain.  No nausea or vomiting.  No chest pain.  No dyspnea on exertion or diaphoresis. She certainly has no systemic symptoms, no fever and no weight loss. Right upper quadrant abdominal pain From 02/16/2020-Made referral back to GI or possibly CT scan.  Start with ultrasound.  Again, she may need CT of chest and abdomen.  Patient is here to discuss abdominal US results that were done 02/22/2020.      Medications: Outpatient Medications Prior to Visit  Medication Sig  . calcium citrate-vitamin D 500-400 MG-UNIT chewable tablet Chew by mouth daily.  . cetirizine (ZYRTEC) 10 MG tablet Take 10 mg by mouth daily.  . cyanocobalamin (,VITAMIN B-12,) 1000 MCG/ML injection Inject 1,000 mcg into the muscle every 30 (thirty) days.   . ferrous sulfate 325 (65 FE) MG tablet Take 325 mg by mouth daily.  . magnesium oxide (MAG-OX) 400 MG tablet Take 400 mg by mouth daily.  [name] omeprazole (PRILOSEC) 40 MG capsule Take 1 capsule (40 mg total) by mouth daily.  [name] thyroid (ARMOUR) 60 MG tablet Take by mouth.  . TURMERIC PO Take by mouth.  . Vitamin D, Ergocalciferol, (DRISDOL) 1.25 MG (50000 UT) CAPS capsule Take 50,000 Units by mouth once a week.  Francia Greaves THYROID 60 MG tablet  (Patient not taking: Reported on 02/25/2020)  . buPROPion (WELLBUTRIN XL)  150 MG 24 hr tablet Take 1 tablet (150 mg total) by mouth daily. (Patient not taking: Reported on 06/09/2019)  . DEXILANT 60 MG capsule  (Patient not taking: Reported on 02/16/2020)  . Krill Oil 1000 MG CAPS Take by mouth. (Patient not taking: Reported on 02/16/2020)  . nortriptyline (PAMELOR) 10 MG capsule Take 40 mg by mouth at bedtime.  (Patient not taking: Reported on 02/16/2020)   No facility-administered medications prior to visit.    Review of Systems  Constitutional: Negative for appetite change, chills, fatigue and fever.  Respiratory: Negative for chest tightness and shortness of breath.   Cardiovascular: Negative for chest pain and palpitations.  Gastrointestinal: Negative for abdominal pain, nausea and vomiting.  Neurological: Negative for dizziness and weakness.       Objective    BP 117/75 (BP Location: Right Arm, Patient Position: Sitting, Cuff Size: Large)   Pulse 82   Temp 98.2 F (36.8 C) (Oral)   Resp 16   Ht 5\' 4"  (1.626 m)   Wt 171 lb (77.6 kg)   SpO2 97%   BMI 29.35 kg/m  Wt Readings from Last 3 Encounters:  02/25/20 171 lb (77.6 kg)  02/16/20 172 lb 9.6 oz (78.3 kg)  06/09/19 163 lb 9.6 oz (74.2 kg)      Physical Exam Vitals reviewed.  Constitutional:      Appearance: She is well-developed.  HENT:     Head: Normocephalic  and atraumatic.     Right Ear: External ear normal.     Left Ear: External ear normal.     Nose: Nose normal.  Eyes:     General: No scleral icterus.    Conjunctiva/sclera: Conjunctivae normal.  Neck:     Thyroid: No thyromegaly.  Cardiovascular:     Rate and Rhythm: Normal rate and regular rhythm.     Heart sounds: Normal heart sounds.  Pulmonary:     Effort: Pulmonary effort is normal.     Breath sounds: Normal breath sounds.  Chest:     Comments: Mild chest wall tenderness in the right lower lateral ribs which reproduces the pain she is having. Twisting motion of her torso also reproduces the pain she is  having. Abdominal:     Palpations: Abdomen is soft.     Comments: Negative Murphy sign.  Mild tenderness over the lower right rib area.  Lymphadenopathy:     Cervical: No cervical adenopathy.  Skin:    General: Skin is warm and dry.  Neurological:     Mental Status: She is alert and oriented to person, place, and time.  Psychiatric:        Behavior: Behavior normal.        Thought Content: Thought content normal.        Judgment: Judgment normal.       No results found for any visits on 02/25/20.  Assessment & Plan     1. Right upper quadrant abdominal pain I do not think this is a GI issue.  2. Right-sided chest wall pain I think this is a strain of the right lateral rib.  Use heat and ice.  Pain x-ray and rib detail.  May need CT but I think this is a benign condition.  I will follow up in a few weeks. - DG Ribs Unilateral W/Chest Right - predniSONE (DELTASONE) 5 MG tablet; Take 6 tablets day 1, 5 tablets day 2, 4 tablets day 3, 3 tablets day 4, 2 tablets day 5, 1 tablet day 6  Dispense: 21 tablet; Refill: 0   No follow-ups on file.         Neev Mcmains Cranford Mon, MD  [facility] [phone] (phone) [phone] (fax)  [place]

## 2020-02-25 ENCOUNTER — Ambulatory Visit
Admission: RE | Admit: 2020-02-25 | Discharge: 2020-02-25 | Disposition: A | Discharge status: Home / Self Care | Payer: Managed Care, Other (non HMO) | Attending: Family Medicine | Admitting: Family Medicine

## 2020-02-25 ENCOUNTER — Ambulatory Visit
Admission: RE | Admit: 2020-02-25 | Discharge: 2020-02-25 | Disposition: A | Discharge status: Home / Self Care | Payer: Managed Care, Other (non HMO) | Source: Ambulatory Visit | Attending: Family Medicine | Admitting: Family Medicine

## 2020-02-25 ENCOUNTER — Other Ambulatory Visit: Payer: Self-pay

## 2020-02-25 ENCOUNTER — Ambulatory Visit: Payer: Managed Care, Other (non HMO) | Admitting: Family Medicine

## 2020-02-25 ENCOUNTER — Encounter: Payer: Self-pay | Admitting: Family Medicine

## 2020-02-25 VITALS — BP 117/75 | HR 82 | Temp 98.2°F | Resp 16 | Ht 64.0 in | Wt 171.0 lb

## 2020-02-25 DIAGNOSIS — R0789 Other chest pain: Secondary | ICD-10-CM | POA: Insufficient documentation

## 2020-02-25 DIAGNOSIS — R1011 Right upper quadrant pain: Secondary | ICD-10-CM

## 2020-02-25 MED ORDER — PREDNISONE 5 MG PO TABS: ORAL_TABLET | ORAL | 0 refills | Status: DC

## 2020-03-08 NOTE — Progress Notes (Signed)
Trena Platt Cummings,acting as a scribe for Wilhemena Durie, MD.,have documented all relevant documentation on the behalf of Wilhemena Durie, MD,as directed by  Wilhemena Durie, MD while in the presence of Wilhemena Durie, MD.  Established patient visit   Patient: Rachel Noble   DOB: [birth year]   64 y.o. Female  MRN: [id number] Visit Date: 03/10/2020  Today's healthcare provider: Wilhemena Durie, MD   Chief Complaint  Patient presents with  . Follow-up   Subjective    HPI   Patient presents today in office for a 2 week follow up on right side chest wall pain. Patient says that she would not classify it as chest pain, she would classify it as upper abdominal pain. Patient says that the pain is no better.  Patient states the pain is more right upper quadrant but has no aggravating or alleviating factors.  There is no skin rash.  It is not associated with eating.  Right-sided chest wall pain From 02/25/2020-I think this is a strain of the right lateral rib.  Use heat and ice.  Pain x-ray and rib detail.  May need CT but I think this is a benign condition.  I will follow up in a few weeks. X-Ray of Ribs obtained showing-normal. Given rx for prednisone taper.        Medications: Outpatient Medications Prior to Visit  Medication Sig  . calcium citrate-vitamin D 500-400 MG-UNIT chewable tablet Chew by mouth daily.  . cetirizine (ZYRTEC) 10 MG tablet Take 10 mg by mouth daily.  . cyanocobalamin (,VITAMIN B-12,) 1000 MCG/ML injection Inject 1,000 mcg into the muscle every 30 (thirty) days.   . ferrous sulfate 325 (65 FE) MG tablet Take 325 mg by mouth daily.  . magnesium oxide (MAG-OX) 400 MG tablet Take 400 mg by mouth daily.  [name] omeprazole (PRILOSEC) 40 MG capsule Take 1 capsule (40 mg total) by mouth daily.  [name] thyroid (NP THYROID) 60 MG tablet Take 60 mg by mouth daily before breakfast.  . TURMERIC PO Take by mouth.  . Vitamin D, Ergocalciferol, (DRISDOL) 1.25 MG  (50000 UT) CAPS capsule Take 50,000 Units by mouth once a week.  . [DISCONTINUED] ARMOUR THYROID 60 MG tablet  (Patient not taking: Reported on 02/25/2020)  . [DISCONTINUED] buPROPion (WELLBUTRIN XL) 150 MG 24 hr tablet Take 1 tablet (150 mg total) by mouth daily. (Patient not taking: Reported on 06/09/2019)  . [DISCONTINUED] DEXILANT 60 MG capsule  (Patient not taking: Reported on 02/16/2020)  . [DISCONTINUED] Krill Oil 1000 MG CAPS Take by mouth. (Patient not taking: Reported on 02/16/2020)  . [DISCONTINUED] nortriptyline (PAMELOR) 10 MG capsule Take 40 mg by mouth at bedtime.  (Patient not taking: Reported on 02/16/2020)  . [DISCONTINUED] predniSONE (DELTASONE) 5 MG tablet Take 6 tablets day 1, 5 tablets day 2, 4 tablets day 3, 3 tablets day 4, 2 tablets day 5, 1 tablet day 6 (Patient not taking: Reported on 03/10/2020)  . [DISCONTINUED] thyroid (ARMOUR) 60 MG tablet Take by mouth. (Patient not taking: Reported on 03/10/2020)   No facility-administered medications prior to visit.    Review of Systems  Constitutional: Negative for appetite change, chills, fatigue and fever.  Respiratory: Negative for chest tightness and shortness of breath.   Cardiovascular: Negative for chest pain and palpitations.  Gastrointestinal: Negative for abdominal pain, nausea and vomiting.  Neurological: Negative for dizziness and weakness.       Objective    There were no vitals  taken for this visit. Wt Readings from Last 3 Encounters:  03/10/20 173 lb 9.6 oz (78.7 kg)  02/25/20 171 lb (77.6 kg)  02/16/20 172 lb 9.6 oz (78.3 kg)      Physical Exam Vitals reviewed.  Constitutional:      Appearance: She is well-developed.  HENT:     Head: Normocephalic and atraumatic.     Right Ear: External ear normal.     Left Ear: External ear normal.     Nose: Nose normal.  Eyes:     General: No scleral icterus.    Conjunctiva/sclera: Conjunctivae normal.  Neck:     Thyroid: No thyromegaly.  Cardiovascular:      Rate and Rhythm: Normal rate and regular rhythm.     Heart sounds: Normal heart sounds.  Pulmonary:     Effort: Pulmonary effort is normal.     Breath sounds: Normal breath sounds.  Abdominal:     Palpations: Abdomen is soft.     Comments: Negative Murphy sign.  Mild tenderness over the lower right rib area.  Lymphadenopathy:     Cervical: No cervical adenopathy.  Skin:    General: Skin is warm and dry.  Neurological:     Mental Status: She is alert and oriented to person, place, and time.  Psychiatric:        Mood and Affect: Mood normal.        Behavior: Behavior normal.        Thought Content: Thought content normal.        Judgment: Judgment normal.       No results found for any visits on 03/10/20.  Assessment & Plan     1. Right upper quadrant abdominal pain I am uncertain of the etiology of discomfort for the patient.  She is status post cholecystectomy and has hepatic steatosis on ultrasound.  Will change omeprazole to pantoprazole and obtain CT of the abdomen and refer to GI. Stop po iron. - Ambulatory referral to Gastroenterology - CT Abdomen Pelvis W Contrast; Future - pantoprazole (PROTONIX) 40 MG tablet; Take 1 tablet (40 mg total) by mouth daily.  Dispense: 90 tablet; Refill: 1 - POCT urinalysis dipstick  2. Right-sided chest wall pain   3. Anxiety   4. Fatigue, unspecified type Clinically improved.   No follow-ups on file.         Ceil Roderick Cranford Mon, MD  [facility] [phone] (phone) [phone] (fax)  [place]

## 2020-03-10 ENCOUNTER — Ambulatory Visit: Payer: Managed Care, Other (non HMO) | Admitting: Family Medicine

## 2020-03-10 ENCOUNTER — Encounter: Payer: Self-pay | Admitting: Family Medicine

## 2020-03-10 ENCOUNTER — Other Ambulatory Visit: Payer: Self-pay

## 2020-03-10 ENCOUNTER — Ambulatory Visit: Payer: Self-pay | Admitting: Family Medicine

## 2020-03-10 VITALS — BP 112/72 | HR 80 | Temp 98.4°F | Ht 64.0 in | Wt 173.6 lb

## 2020-03-10 DIAGNOSIS — R1011 Right upper quadrant pain: Secondary | ICD-10-CM | POA: Diagnosis not present

## 2020-03-10 DIAGNOSIS — R5383 Other fatigue: Secondary | ICD-10-CM

## 2020-03-10 DIAGNOSIS — F419 Anxiety disorder, unspecified: Secondary | ICD-10-CM

## 2020-03-10 DIAGNOSIS — R0789 Other chest pain: Secondary | ICD-10-CM | POA: Diagnosis not present

## 2020-03-10 LAB — POCT URINALYSIS DIPSTICK
Bilirubin, UA: NEGATIVE
Blood, UA: NEGATIVE
Glucose, UA: NEGATIVE
Ketones, UA: NEGATIVE
Leukocytes, UA: NEGATIVE
Nitrite, UA: NEGATIVE
Protein, UA: NEGATIVE
Spec Grav, UA: 1.025 (ref 1.010–1.025)
Urobilinogen, UA: 0.2 E.U./dL
pH, UA: 6 (ref 5.0–8.0)

## 2020-03-10 MED ORDER — PANTOPRAZOLE SODIUM 40 MG PO TBEC: 40.0000 mg | DELAYED_RELEASE_TABLET | Freq: Every day | ORAL | 1 refills | Status: DC

## 2020-03-10 NOTE — Patient Instructions (Signed)
STOP IRON

## 2020-03-29 ENCOUNTER — Other Ambulatory Visit: Payer: Self-pay

## 2020-03-29 ENCOUNTER — Ambulatory Visit
Admission: RE | Admit: 2020-03-29 | Discharge: 2020-03-29 | Disposition: A | Discharge status: Home / Self Care | Payer: Managed Care, Other (non HMO) | Source: Ambulatory Visit | Attending: Family Medicine | Admitting: Family Medicine

## 2020-03-29 DIAGNOSIS — R1011 Right upper quadrant pain: Secondary | ICD-10-CM | POA: Insufficient documentation

## 2020-03-29 LAB — POCT I-STAT CREATININE: Creatinine, Ser: 1 mg/dL (ref 0.44–1.00)

## 2020-03-29 MED ORDER — IOHEXOL 300 MG/ML  SOLN
100.0000 mL | Freq: Once | INTRAMUSCULAR | Status: AC | PRN
  Administered 2020-03-29: 100 mL via INTRAVENOUS

## 2020-03-30 ENCOUNTER — Encounter: Payer: Self-pay | Admitting: Family Medicine

## 2020-03-30 ENCOUNTER — Telehealth: Payer: Self-pay

## 2020-03-30 NOTE — Telephone Encounter (Signed)
-----   Message from [name], MD sent at 03/30/2020  1:07 PM EDT ----- CT scan does not reveal any findings for pain in the right upper abdomen. There is slight possible thickening in the sigmoid colon but that should cause discomfort in the left lower abdomen.  Keep appointment with GI.

## 2020-03-30 NOTE — Telephone Encounter (Signed)
Patient advised of results via mychart and has read the providers comments.

## 2020-04-05 ENCOUNTER — Telehealth: Payer: Self-pay

## 2020-04-05 DIAGNOSIS — R0782 Intercostal pain: Secondary | ICD-10-CM

## 2020-04-05 NOTE — Telephone Encounter (Signed)
Copied from [place] [id number]. Topic: General - Inquiry >> Apr 05, 2020  4:29 PM Rachel Noble wrote: Reason for CRM: patient is requesting an MRI done.  Patient states the CT scan did not show anything regarding her pain in stomach and rib cage. Call back [phone]

## 2020-04-06 ENCOUNTER — Other Ambulatory Visit: Payer: Self-pay | Admitting: *Deleted

## 2020-04-06 MED ORDER — PREDNISONE 10 MG PO TABS: ORAL_TABLET | ORAL | 0 refills | Status: AC

## 2020-04-06 NOTE — Telephone Encounter (Signed)
PT is very confuse about CT scan, need to speak with a nurse  / please advise

## 2020-04-07 NOTE — Telephone Encounter (Signed)
CT of chest with and without,MRI would now be helpful at this time.  Appointment with me 2 to 3 days after CT is done

## 2020-04-07 NOTE — Telephone Encounter (Signed)
Spoke with patient and she states that last office visit it was discussed about her rib xray and Abdominal CT. Patient states that she wasn't sure if she had made it known that her pain is primarily around her ribs radiating across and to her back. Patient states that on examination when in office she did have pain when you pressed on abdomen but states that she had a lot of pain when you pressed near her ribs. Patient states that she has concerns if were going in the right direction with treatment plan. Patient states that she wanted to know the difference between a MRI and CT? Patient states that when she presses rib cage pain can be moderate to severe at times, patient states that it is to the point that she cannot lift a bag of dog food without being in pain. KW

## 2020-04-11 NOTE — Addendum Note (Signed)
Addended by: Minette Headland on: 04/11/2020 09:24 AM   Modules accepted: Orders

## 2020-04-11 NOTE — Addendum Note (Signed)
Addended by: Minette Headland on: 04/11/2020 09:21 AM   Modules accepted: Orders

## 2020-04-11 NOTE — Telephone Encounter (Signed)
Patient advised.KW 

## 2020-04-18 ENCOUNTER — Telehealth: Payer: Self-pay

## 2020-04-18 NOTE — Telephone Encounter (Unsigned)
Copied from [place] [id number]. Topic: General - Other >> Apr 18, 2020 11:21 AM Rainey Pines A wrote: Patient has not heard anything about the CT scan for her lungs that was ordered over a week ago and patient would like an update as soon possible.

## 2020-04-18 NOTE — Telephone Encounter (Signed)
I see it was ordered on the 13th?  Can we check on status?

## 2020-04-18 NOTE — Telephone Encounter (Signed)
Please review. Thanks!  

## 2020-04-25 ENCOUNTER — Other Ambulatory Visit: Payer: Self-pay

## 2020-04-25 ENCOUNTER — Ambulatory Visit
Admission: RE | Admit: 2020-04-25 | Discharge: 2020-04-25 | Disposition: A | Discharge status: Home / Self Care | Payer: Managed Care, Other (non HMO) | Source: Ambulatory Visit | Attending: Family Medicine | Admitting: Family Medicine

## 2020-04-25 DIAGNOSIS — R0782 Intercostal pain: Secondary | ICD-10-CM | POA: Diagnosis not present

## 2020-04-29 NOTE — Progress Notes (Signed)
I,Darrius Montano,acting as a scribe for Wilhemena Durie, MD.,have documented all relevant documentation on the behalf of Wilhemena Durie, MD,as directed by  Wilhemena Durie, MD while in the presence of Wilhemena Durie, MD.   Established patient visit   Patient: Rachel Noble   DOB: 29-Dec-1955   64 y.o. Female  MRN: [id number] Visit Date: 05/02/2020  Today's healthcare provider: Wilhemena Durie, MD   Chief Complaint  Patient presents with  . Follow-up   Subjective    HPI  Patient comes in today accompanied by her husband for follow-up of chronic right lower lateral and anterior rib pain.  The pain extends in the right upper quadrant.  No GI symptoms.  The pain is to deep palpation of these areas and to any heavy lifting that she does that aggravates the pain.  She notices it most the time and is a dull pain.  No burning and no sharp sensation.  No alleviating factors other than some rest. CT scan did reveal an old T4 compression fracture likely present.  Otherwise CT of the abdomen pelvis and chest are essentially normal.  She does have some pulmonary  nodules which need follow-up CT in 3 to 6 months Right upper quadrant abdominal pain From 4/40/3474-Q am uncertain of the etiology of discomfort for the patient.  She is status post cholecystectomy and has hepatic steatosis on ultrasound.  Will change omeprazole to pantoprazole and obtain CT of the abdomen and refer to GI. Stop po iron.      Medications: Outpatient Medications Prior to Visit  Medication Sig  . calcium citrate-vitamin D 500-400 MG-UNIT chewable tablet Chew by mouth daily.  . cetirizine (ZYRTEC) 10 MG tablet Take 10 mg by mouth daily.  . cyanocobalamin (,VITAMIN B-12,) 1000 MCG/ML injection Inject 1,000 mcg into the muscle every 30 (thirty) days.   . ferrous sulfate 325 (65 FE) MG tablet Take 325 mg by mouth daily.  . magnesium oxide (MAG-OX) 400 MG tablet Take 400 mg by mouth daily.  [name] omeprazole  (PRILOSEC) 40 MG capsule Take 1 capsule (40 mg total) by mouth daily.  . pantoprazole (PROTONIX) 40 MG tablet Take 1 tablet (40 mg total) by mouth daily.  [name] thyroid (NP THYROID) 60 MG tablet Take 60 mg by mouth daily before breakfast.  . TURMERIC PO Take by mouth.  . Vitamin D, Ergocalciferol, (DRISDOL) 1.25 MG (50000 UT) CAPS capsule Take 50,000 Units by mouth once a week.  . predniSONE (DELTASONE) 10 MG tablet Take 6 tablets day 1, 5 tablets day 2, 4 tablets day 3, 3 tablets day 4, 2 tablets day 5, 1 tablet day 6 (Patient not taking: Reported on 05/02/2020)   No facility-administered medications prior to visit.    Review of Systems  Constitutional: Negative for appetite change, chills, fatigue and fever.  Respiratory: Negative for chest tightness and shortness of breath.   Cardiovascular: Negative for chest pain and palpitations.  Gastrointestinal: Negative for abdominal pain, nausea and vomiting.  Neurological: Negative for dizziness and weakness.      Objective    BP 135/71 (BP Location: Left Arm, Patient Position: Sitting, Cuff Size: Large)   Pulse 87   Temp 98.4 F (36.9 C) (Oral)   Resp 16   Ht 5\' 4"  (1.626 m)   Wt 173 lb (78.5 kg)   SpO2 99%   BMI 29.70 kg/m    Physical Exam Vitals reviewed.  Constitutional:      Appearance: She is  well-developed.  HENT:     Head: Normocephalic and atraumatic.     Right Ear: External ear normal.     Left Ear: External ear normal.     Nose: Nose normal.  Eyes:     General: No scleral icterus.    Conjunctiva/sclera: Conjunctivae normal.  Neck:     Thyroid: No thyromegaly.  Cardiovascular:     Rate and Rhythm: Normal rate and regular rhythm.     Heart sounds: Normal heart sounds.  Pulmonary:     Effort: Pulmonary effort is normal.     Breath sounds: Normal breath sounds.  Abdominal:     Palpations: Abdomen is soft.     Comments: Negative Murphy sign.  Mild tenderness over the lower right rib area.  Lymphadenopathy:      Cervical: No cervical adenopathy.  Skin:    General: Skin is warm and dry.  Neurological:     General: No focal deficit present.     Mental Status: She is alert and oriented to person, place, and time.  Psychiatric:        Mood and Affect: Mood normal.        Behavior: Behavior normal.        Thought Content: Thought content normal.        Judgment: Judgment normal.     BP 135/71 (BP Location: Left Arm, Patient Position: Sitting, Cuff Size: Large)   Pulse 87   Temp 98.4 F (36.9 C) (Oral)   Resp 16   Ht 5\' 4"  (1.626 m)   Wt 173 lb (78.5 kg)   SpO2 99%   BMI 29.70 kg/m      No results found for any visits on 05/02/20.  Assessment & Plan     1. Gastroesophageal reflux disease, unspecified whether esophagitis present Stopped pantoprazole. Restarted omeprazole 40 mg in the mornings. - omeprazole (PRILOSEC) 40 MG capsule; Take 1 capsule (40 mg total) by mouth daily.  Dispense: 90 capsule; Refill: 1  2. Rib pain on right side I do not think this is her liver or GI tract at all.  It is possible that she has a thoracic radiculopathy but I will defer to pain clinic for treatment and and any further evaluation of this pain.  It is also possible that she has osteoporotic microfractures but she has no direct tenderness over spine. I am not sure of any other possible etiology of this pain.  Appears to be musculoskeletal. More than 50% 25 minute visit spent in counseling or coordination of care  - Ambulatory referral to Pain Clinic   No follow-ups on file.      I, Wilhemena Durie, MD, have reviewed all documentation for this visit. The documentation on 05/04/20 for the exam, diagnosis, procedures, and orders are all accurate and complete.    Richard Cranford Mon, MD  [facility] [phone] (phone) [phone] (fax)  [place]

## 2020-05-02 ENCOUNTER — Other Ambulatory Visit: Payer: Self-pay

## 2020-05-02 ENCOUNTER — Ambulatory Visit (INDEPENDENT_AMBULATORY_CARE_PROVIDER_SITE_OTHER): Payer: Managed Care, Other (non HMO) | Admitting: Family Medicine

## 2020-05-02 ENCOUNTER — Encounter: Payer: Self-pay | Admitting: Family Medicine

## 2020-05-02 VITALS — BP 135/71 | HR 87 | Temp 98.4°F | Resp 16 | Ht 64.0 in | Wt 173.0 lb

## 2020-05-02 DIAGNOSIS — R0781 Pleurodynia: Secondary | ICD-10-CM

## 2020-05-02 DIAGNOSIS — K219 Gastro-esophageal reflux disease without esophagitis: Secondary | ICD-10-CM | POA: Diagnosis not present

## 2020-05-02 MED ORDER — OMEPRAZOLE 40 MG PO CPDR: 40.0000 mg | DELAYED_RELEASE_CAPSULE | Freq: Every day | ORAL | 1 refills | Status: DC

## 2020-05-02 NOTE — Patient Instructions (Addendum)
Stop pantoprazole. Restart omeprazole 40 mg in the mornings.

## 2020-06-08 ENCOUNTER — Ambulatory Visit: Payer: Managed Care, Other (non HMO) | Admitting: Student in an Organized Health Care Education/Training Program

## 2020-06-28 ENCOUNTER — Ambulatory Visit: Payer: Managed Care, Other (non HMO) | Admitting: Student in an Organized Health Care Education/Training Program

## 2020-07-26 ENCOUNTER — Other Ambulatory Visit: Payer: Self-pay

## 2020-07-26 ENCOUNTER — Other Ambulatory Visit: Payer: Managed Care, Other (non HMO)

## 2020-07-26 DIAGNOSIS — E559 Vitamin D deficiency, unspecified: Secondary | ICD-10-CM | POA: Diagnosis not present

## 2020-07-26 DIAGNOSIS — E039 Hypothyroidism, unspecified: Secondary | ICD-10-CM | POA: Diagnosis not present

## 2020-07-27 LAB — TSH: TSH: 1.43 u[IU]/mL (ref 0.450–4.500)

## 2020-07-27 LAB — T4, FREE: Free T4: 0.75 ng/dL — ABNORMAL LOW (ref 0.82–1.77)

## 2020-07-27 LAB — T3, FREE: T3, Free: 4.2 pg/mL (ref 2.0–4.4)

## 2020-07-27 LAB — VITAMIN D 25 HYDROXY (VIT D DEFICIENCY, FRACTURES): Vit D, 25-Hydroxy: 41.3 ng/mL (ref 30.0–100.0)

## 2020-08-04 ENCOUNTER — Telehealth: Payer: Self-pay

## 2020-08-04 NOTE — Telephone Encounter (Signed)
Copied from CRM [id number]. Topic: General - Other >> Aug 04, 2020 11:58 AM Jaquita Rector A wrote: Reason for CRM: Patient called in to inquire of Dr Sullivan Lone that she did not think the appointment that was scheduled was needed because she have yet to have the lung scan that was discussed. Patient asking Dr Sullivan Lone to please send the referral to imaging so that she can have this done then come in to discuss the findings. Please call Ph# [phone]

## 2020-08-08 ENCOUNTER — Other Ambulatory Visit: Payer: Managed Care, Other (non HMO)

## 2020-08-08 NOTE — Telephone Encounter (Signed)
Follow-up chest CT for lung nodules from September scan.

## 2020-08-09 ENCOUNTER — Ambulatory Visit: Payer: Self-pay | Admitting: Family Medicine

## 2020-10-31 ENCOUNTER — Other Ambulatory Visit: Payer: Self-pay | Admitting: Family Medicine

## 2020-10-31 DIAGNOSIS — K219 Gastro-esophageal reflux disease without esophagitis: Secondary | ICD-10-CM

## 2020-11-10 ENCOUNTER — Encounter: Payer: Self-pay | Admitting: Family Medicine

## 2020-11-24 ENCOUNTER — Other Ambulatory Visit: Payer: Self-pay | Admitting: Family Medicine

## 2020-11-24 DIAGNOSIS — K219 Gastro-esophageal reflux disease without esophagitis: Secondary | ICD-10-CM

## 2020-11-24 NOTE — Telephone Encounter (Signed)
  Notes to clinic: Review for refill Patient was going to schedule follow up on medication  No appt has been scheduled yet    Requested Prescriptions  Pending Prescriptions Disp Refills   omeprazole (PRILOSEC) 40 MG capsule [Pharmacy Med Name: OMEPRAZOLE DR 40 MG CAPSULE] 30 capsule 0    Sig: Take 1 capsule (40 mg total) by mouth daily.      Gastroenterology: Proton Pump Inhibitors Passed - 11/24/2020  1:52 PM      Passed - Valid encounter within last 12 months    Recent Outpatient Visits           6 months ago Gastroesophageal reflux disease, unspecified whether esophagitis present   [facility] [name], MD   8 months ago Right upper quadrant abdominal pain   [facility] [name], MD   9 months ago Right upper quadrant abdominal pain   [facility] [name], MD   9 months ago Right upper quadrant abdominal pain   [facility] [name], MD   2 years ago Fatigue, unspecified type   [facility] [name], MD

## 2021-02-07 ENCOUNTER — Other Ambulatory Visit: Payer: Self-pay

## 2021-02-07 ENCOUNTER — Other Ambulatory Visit: Payer: Managed Care, Other (non HMO)

## 2021-02-07 DIAGNOSIS — E039 Hypothyroidism, unspecified: Secondary | ICD-10-CM

## 2021-02-07 DIAGNOSIS — E559 Vitamin D deficiency, unspecified: Secondary | ICD-10-CM

## 2021-02-07 DIAGNOSIS — R5383 Other fatigue: Secondary | ICD-10-CM

## 2021-02-08 LAB — TSH+FREE T4
Free T4: 0.69 ng/dL — ABNORMAL LOW (ref 0.82–1.77)
TSH: 2.41 u[IU]/mL (ref 0.450–4.500)

## 2021-02-08 LAB — T3, FREE: T3, Free: 2.4 pg/mL (ref 2.0–4.4)

## 2021-02-08 LAB — VITAMIN D 25 HYDROXY (VIT D DEFICIENCY, FRACTURES): Vit D, 25-Hydroxy: 56.5 ng/mL (ref 30.0–100.0)

## 2021-02-08 LAB — VITAMIN B12: Vitamin B-12: >2000 pg/mL — ABNORMAL HIGH (ref 232–1245)

## 2021-03-23 ENCOUNTER — Telehealth: Payer: Self-pay | Admitting: Family Medicine

## 2021-03-23 DIAGNOSIS — K219 Gastro-esophageal reflux disease without esophagitis: Secondary | ICD-10-CM

## 2021-03-23 MED ORDER — OMEPRAZOLE 40 MG PO CPDR: 40.0000 mg | DELAYED_RELEASE_CAPSULE | Freq: Every day | ORAL | 5 refills | Status: DC

## 2021-03-23 NOTE — Telephone Encounter (Signed)
Pt called in for assistance. Pt says that she has a scheduled apt with PCP, pt says that she will not have enough of her medication Omeprazole until her apt, she would like to know if she could have a refill until? Also pt says that she get free lab work at her husband's job, if blood work is needed, she would like to have the order faxed to [facility]   Fax: [phone]    Pt would like a call back to confirm so that she is able to schedule with their lab if need be.   Phone: [phone]

## 2021-03-23 NOTE — Telephone Encounter (Signed)
Please review.  Is it okay to have her labs drawn at her husbands job?  What do you need order?  Thanks,   -Mickel Baas

## 2021-03-23 NOTE — Telephone Encounter (Signed)
Will order appropriate labs when I see her on sept 6

## 2021-03-23 NOTE — Telephone Encounter (Signed)
Advised patient as below.  

## 2021-04-04 ENCOUNTER — Other Ambulatory Visit: Payer: Self-pay

## 2021-04-04 ENCOUNTER — Ambulatory Visit: Payer: Managed Care, Other (non HMO) | Admitting: Family Medicine

## 2021-04-04 ENCOUNTER — Encounter: Payer: Self-pay | Admitting: Family Medicine

## 2021-04-04 VITALS — BP 127/73 | HR 97 | Temp 98.3°F | Resp 16 | Ht 64.0 in | Wt 167.0 lb

## 2021-04-04 DIAGNOSIS — Z1159 Encounter for screening for other viral diseases: Secondary | ICD-10-CM

## 2021-04-04 DIAGNOSIS — S22000A Wedge compression fracture of unspecified thoracic vertebra, initial encounter for closed fracture: Secondary | ICD-10-CM

## 2021-04-04 DIAGNOSIS — Z0000 Encounter for general adult medical examination without abnormal findings: Secondary | ICD-10-CM | POA: Diagnosis not present

## 2021-04-04 DIAGNOSIS — G629 Polyneuropathy, unspecified: Secondary | ICD-10-CM

## 2021-04-04 DIAGNOSIS — F419 Anxiety disorder, unspecified: Secondary | ICD-10-CM | POA: Diagnosis not present

## 2021-04-04 DIAGNOSIS — R1011 Right upper quadrant pain: Secondary | ICD-10-CM

## 2021-04-04 DIAGNOSIS — R918 Other nonspecific abnormal finding of lung field: Secondary | ICD-10-CM

## 2021-04-04 NOTE — Progress Notes (Signed)
Complete physical exam   Patient: Rachel Noble   DOB: 1956/03/19   65 y.o. Female  MRN: [id number] Visit Date: 04/04/2021  Today's healthcare provider: Wilhemena Durie, MD   Chief Complaint  Patient presents with   Annual Exam   Subjective    Rachel Noble is a 65 y.o. female who presents today for a complete physical exam.  She reports consuming a general diet. The patient does not participate in regular exercise at present. She generally feels fairly well. She reports sleeping well. She does not have additional problems to discuss today.  She has 2 children and 3 grandchildren ages 52 and 4.  Well woman exam is done at [facility] clinic GYN. She has 2 concerns in addition to her exam today.  She has chronic neuropathy for which she states she is intolerant to gabapentin and Lyrica. She also has an ongoing right upper quadrant right lower chest wall pain for the past couple of years.  Past Medical History:  Diagnosis Date   Cancer ([place])    skin   GERD (gastroesophageal reflux disease)    Idiopathic neuropathy    Thyroid disease    Past Surgical History:  Procedure Laterality Date   ABDOMINAL HYSTERECTOMY     bowel blockage     CESAREAN SECTION     x 2   CHOLECYSTECTOMY     ELBOW SURGERY Right    HERNIA REPAIR     LASIK     PLANTAR FASCIA SURGERY Right    Social History   Socioeconomic History   Marital status: Married    Spouse name: Not on file   Number of children: Not on file   Years of education: Not on file   Highest education level: Not on file  Occupational History   Not on file  Tobacco Use   Smoking status: Never   Smokeless tobacco: Never  Vaping Use   Vaping Use: Never used  Substance and Sexual Activity   Alcohol use: No   Drug use: No   Sexual activity: Not on file  Other Topics Concern   Not on file  Social History Narrative   Not on file   Social Determinants of Health   Financial Resource Strain: Not on file  Food  Insecurity: Not on file  Transportation Needs: Not on file  Physical Activity: Not on file  Stress: Not on file  Social Connections: Not on file  Intimate Partner Violence: Not on file   Family Status  Relation Name Status   Neg Hx  (Not Specified)   Family History  Adopted: Yes  Problem Relation Age of Onset   Breast cancer Neg Hx    No Known Allergies  Patient Care Team: [name], MD as PCP - General (Family Medicine)   Medications: Outpatient Medications Prior to Visit  Medication Sig   calcium citrate-vitamin D 500-400 MG-UNIT chewable tablet Chew by mouth Rachel.   cetirizine (ZYRTEC) 10 MG tablet Take 10 mg by mouth Rachel.   cyanocobalamin (,VITAMIN B-12,) 1000 MCG/ML injection Inject 1,000 mcg into the muscle every 30 (thirty) days.    ferrous sulfate 325 (65 FE) MG tablet Take 325 mg by mouth Rachel.   magnesium oxide (MAG-OX) 400 MG tablet Take 400 mg by mouth Rachel.   omeprazole (PRILOSEC) 40 MG capsule Take 1 capsule (40 mg total) by mouth Rachel.   predniSONE (DELTASONE) 10 MG tablet Take 6 tablets day 1, 5 tablets day  2, 4 tablets day 3, 3 tablets day 4, 2 tablets day 5, 1 tablet day 6 (Patient not taking: Reported on 05/02/2020)   thyroid (NP THYROID) 60 MG tablet Take 60 mg by mouth Rachel before breakfast.   TURMERIC PO Take by mouth.   Vitamin D, Ergocalciferol, (DRISDOL) 1.25 MG (50000 UT) CAPS capsule Take 50,000 Units by mouth once a week.   No facility-administered medications prior to visit.    Review of Systems  All other systems reviewed and are negative.     Objective    BP 127/73   Pulse 97   Temp 98.3 F (36.8 C)   Resp 16   Ht '5\' 4"'$  (1.626 m)   Wt 167 lb (75.8 kg)   BMI 28.67 kg/m  BP Readings from Last 3 Encounters:  04/04/21 127/73  05/02/20 135/71  03/10/20 112/72   Wt Readings from Last 3 Encounters:  04/04/21 167 lb (75.8 kg)  05/02/20 173 lb (78.5 kg)  03/10/20 173 lb 9.6 oz (78.7 kg)      Physical Exam Vitals  reviewed.  Constitutional:      Appearance: She is well-developed.  HENT:     Head: Normocephalic and atraumatic.  Eyes:     Conjunctiva/sclera: Conjunctivae normal.  Cardiovascular:     Rate and Rhythm: Normal rate and regular rhythm.  Pulmonary:     Effort: Pulmonary effort is normal.  Abdominal:     Palpations: Abdomen is soft.     Comments: Mild right upper quadrant tenderness tenderness over the anterior and lateral lower ribs and chest wall on the right.  Lymphadenopathy:     Cervical: No cervical adenopathy.  Skin:    General: Skin is warm and dry.  Neurological:     Mental Status: She is alert and oriented to person, place, and time.  Psychiatric:        Behavior: Behavior normal.        Thought Content: Thought content normal.        Judgment: Judgment normal.      Last depression screening scores PHQ 2/9 Scores 04/04/2021 08/20/2018 06/11/2018  PHQ - 2 Score 0 1 0  PHQ- 9 Score - 6 -   Last fall risk screening Fall Risk  04/04/2021  Falls in the past year? 0  Number falls in past yr: 0  Injury with Fall? 0  Risk for fall due to : No Fall Risks  Follow up Falls evaluation completed   Last Audit-C alcohol use screening Alcohol Use Disorder Test (AUDIT) 04/04/2021  1. How often do you have a drink containing alcohol? 0  2. How many drinks containing alcohol do you have on a typical day when you are drinking? 0  3. How often do you have six or more drinks on one occasion? 0  AUDIT-C Score 0   A score of 3 or more in women, and 4 or more in men indicates increased risk for alcohol abuse, EXCEPT if all of the points are from question 1   No results found for any visits on 04/04/21.  Assessment & Plan    Routine Health Maintenance and Physical Exam  Exercise Activities and Dietary recommendations  Goals   None     Immunization History  Administered Date(s) Administered   Influenza,inj,Quad PF,6+ Mos 06/11/2018   Tdap 09/29/2010    Health Maintenance   Topic Date Due   COVID-19 Vaccine (1) Never done   HIV Screening  Never done   Hepatitis C  Screening  Never done   COLONOSCOPY (Pts 45-6yr Insurance coverage will need to be confirmed)  Never done   Zoster Vaccines- Shingrix (1 of 2) Never done   PAP SMEAR-Modifier  04/19/2018   TETANUS/TDAP  09/28/2020   INFLUENZA VACCINE  02/27/2021   MAMMOGRAM  01/28/2022   Pneumococcal Vaccine [id number] old  Aged Out   HPV VACCINES  Aged Out    Discussed health benefits of physical activity, and encouraged her to engage in regular exercise appropriate for her age and condition.  1. Annual physical exam  - CBC with Differential/Platelet - Comprehensive metabolic panel - Lipid panel - TSH  2. Compression fracture of body of thoracic vertebra (HCC) Compression fracture found on work-up of right chest wall pain and right upper quadrant pain.  Obtain BMD. - DG Bone Density; Future  3. Anxiety   4. Pulmonary nodules Found on chest CT.  Follow-up low-dose chest CT for pulmonary nodule - CT CHEST NODULE FOLLOW UP LOW DOSE W/O; Future  5. Right upper quadrant abdominal pain [facility] have no etiology of his pain under the compression fracture - Ambulatory referral to Gastroenterology  6. Need for hepatitis C screening test  - Hepatitis C antibody  7. Neuropathy Refer to neurology.  Will consider lower dose Lyrica  - Ambulatory referral to Neurology   No follow-ups on file.     I, RWilhemena Durie MD, have reviewed all documentation for this visit. The documentation on 04/06/21 for the exam, diagnosis, procedures, and orders are all accurate and complete.    Shoni Quijas GCranford Mon MD  [facility] [phone](phone) [phone](fax)  [place]

## 2021-04-09 ENCOUNTER — Encounter: Payer: Self-pay | Admitting: Family Medicine

## 2021-04-17 ENCOUNTER — Telehealth: Payer: Self-pay

## 2021-04-17 NOTE — Telephone Encounter (Signed)
Copied from [place] [id number]. Topic: General - Other >> Apr 17, 2021  8:58 AM Parke Poisson wrote: Reason for CRM: Per [facility] the order placed for CT is incorrect. Can someone correct order?

## 2021-04-22 ENCOUNTER — Encounter: Payer: Self-pay | Admitting: Family Medicine

## 2021-04-24 ENCOUNTER — Other Ambulatory Visit: Payer: Self-pay

## 2021-04-24 ENCOUNTER — Other Ambulatory Visit: Payer: Managed Care, Other (non HMO)

## 2021-04-24 DIAGNOSIS — R918 Other nonspecific abnormal finding of lung field: Secondary | ICD-10-CM

## 2021-04-24 DIAGNOSIS — R911 Solitary pulmonary nodule: Secondary | ICD-10-CM

## 2021-04-24 NOTE — Progress Notes (Signed)
Updated order for CT scan.

## 2021-05-02 ENCOUNTER — Ambulatory Visit
Admission: RE | Admit: 2021-05-02 | Discharge: 2021-05-02 | Disposition: A | Discharge status: Home / Self Care | Payer: Managed Care, Other (non HMO) | Source: Ambulatory Visit | Attending: Family Medicine | Admitting: Family Medicine

## 2021-05-02 ENCOUNTER — Other Ambulatory Visit: Payer: Self-pay

## 2021-05-02 DIAGNOSIS — S22000A Wedge compression fracture of unspecified thoracic vertebra, initial encounter for closed fracture: Secondary | ICD-10-CM | POA: Insufficient documentation

## 2021-05-04 ENCOUNTER — Ambulatory Visit
Admission: RE | Admit: 2021-05-04 | Discharge: 2021-05-04 | Disposition: A | Discharge status: Home / Self Care | Payer: Managed Care, Other (non HMO) | Source: Ambulatory Visit | Attending: Family Medicine | Admitting: Family Medicine

## 2021-05-04 ENCOUNTER — Other Ambulatory Visit: Payer: Self-pay

## 2021-05-04 DIAGNOSIS — R918 Other nonspecific abnormal finding of lung field: Secondary | ICD-10-CM | POA: Insufficient documentation

## 2021-05-04 DIAGNOSIS — R911 Solitary pulmonary nodule: Secondary | ICD-10-CM | POA: Diagnosis present

## 2021-06-06 ENCOUNTER — Encounter: Payer: Self-pay | Admitting: Family Medicine

## 2021-06-08 NOTE — Telephone Encounter (Signed)
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 Keep f/u 2023 and will f/u on almost certainly benign pulmonary nodules in future.

## 2021-06-13 ENCOUNTER — Other Ambulatory Visit: Payer: Self-pay

## 2021-06-13 ENCOUNTER — Ambulatory Visit
Admission: RE | Admit: 2021-06-13 | Discharge: 2021-06-13 | Disposition: A | Discharge status: Home / Self Care | Payer: Managed Care, Other (non HMO) | Source: Ambulatory Visit

## 2021-06-13 DIAGNOSIS — Z1231 Encounter for screening mammogram for malignant neoplasm of breast: Secondary | ICD-10-CM

## 2021-06-27 ENCOUNTER — Ambulatory Visit: Payer: Self-pay | Admitting: Neurology

## 2021-07-06 NOTE — Telephone Encounter (Signed)
Pt called about a change in her lungs and a size 5 nodule as well as a T4 compression fracture and now a fracture at T3 with no mention of the T4 fracture anymore / she stated that the message from the provider didn't answer her question/ she is asking about why there has been changes or if the results were missed read / please advise / please call pt asap  / really would like answers today and said she has been pt enough since asking about this in the middle of November

## 2021-07-06 NOTE — Telephone Encounter (Signed)
Please review message below. Patient has questions and wants clarification on the results of her Lung CT scan done on 05/04/2021, in comparison to last years Lung CT done on 04/25/2020.

## 2021-07-17 NOTE — Telephone Encounter (Signed)
Did we hear back from Radiology or did they update the report?

## 2021-09-12 ENCOUNTER — Ambulatory Visit: Payer: Self-pay | Admitting: Neurology

## 2021-09-26 ENCOUNTER — Other Ambulatory Visit: Payer: Self-pay | Admitting: Family Medicine

## 2021-09-26 DIAGNOSIS — K219 Gastro-esophageal reflux disease without esophagitis: Secondary | ICD-10-CM

## 2021-09-26 NOTE — Telephone Encounter (Signed)
Requested Prescriptions  Pending Prescriptions Disp Refills   omeprazole (PRILOSEC) 40 MG capsule [Pharmacy Med Name: OMEPRAZOLE DR 40 MG CAPSULE] 30 capsule 0    Sig: Take 1 capsule (40 mg total) by mouth daily.     Gastroenterology: Proton Pump Inhibitors Passed - 09/26/2021  7:41 AM      Passed - Valid encounter within last 12 months    Recent Outpatient Visits          5 months ago Annual physical exam   [facility] [name], MD   1 year ago Gastroesophageal reflux disease, unspecified whether esophagitis present   [facility] [name], MD   1 year ago Right upper quadrant abdominal pain   [facility] [name], [facility]   1 year ago Right upper quadrant abdominal pain   [facility] [name], MD   1 year ago Right upper quadrant abdominal pain   [facility] [name], MD

## 2022-05-17 IMAGING — CT CT ABD-PELV W/ CM
2 of 5 series · 15 of 46 positions shown, 17 images · IV contrast (omnipaque)
Comparison: CT abdomen pelvis dated 02/05/2011.

CLINICAL DATA: Right upper quadrant abdominal pain for 6-8 months.

EXAM:
CT ABDOMEN AND PELVIS WITH CONTRAST
TECHNIQUE: Multidetector CT imaging of the abdomen and pelvis was performed
using the standard protocol following bolus administration of
intravenous contrast.
CONTRAST:  100mL OMNIPAQUE IOHEXOL 300 MG/ML  SOLN

[Series 2: abd pelvis 5.00 · axial · 0.69mm/px · z∈[-1512,-1082]mm · 12 of 98 slices shown, 14 images]
[im 6/98  soft-tissue]
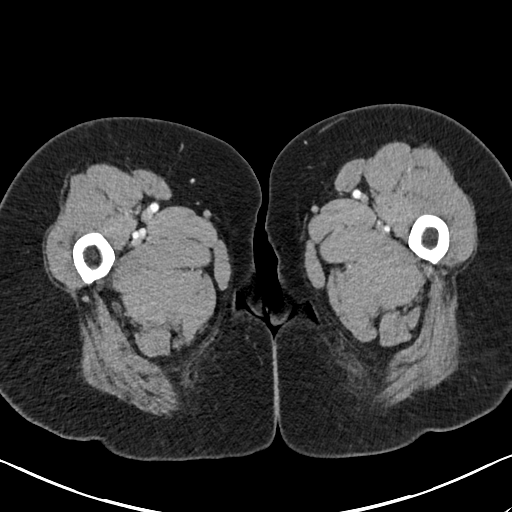
[im 6/98  bone]
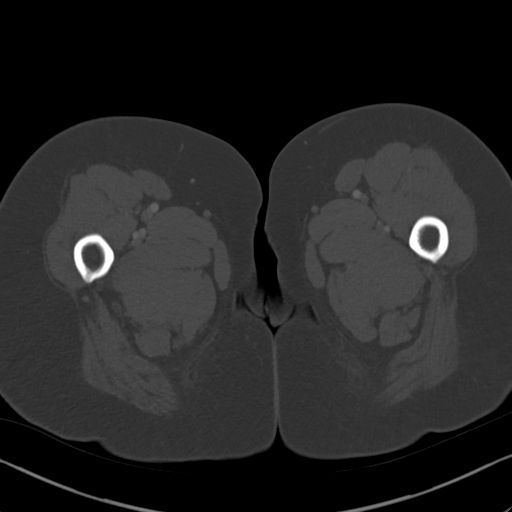
[im 16/98  soft-tissue]
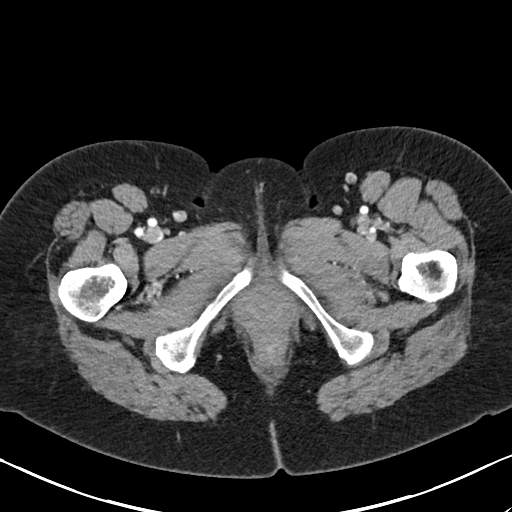
[im 21/98  soft-tissue]
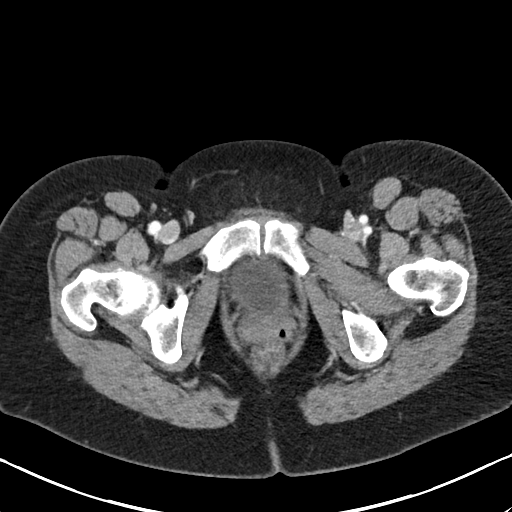
[im 31/98  soft-tissue]
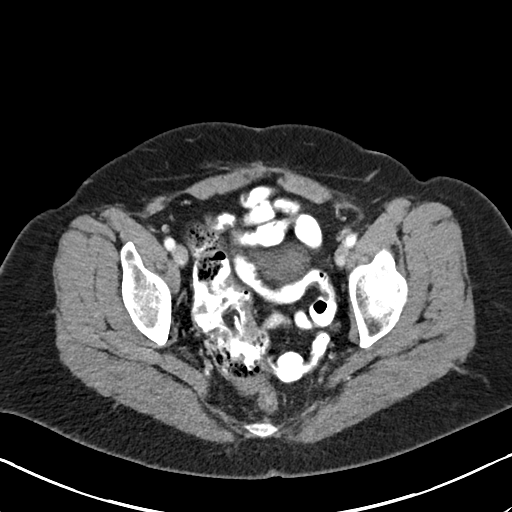
[im 36/98  soft-tissue]
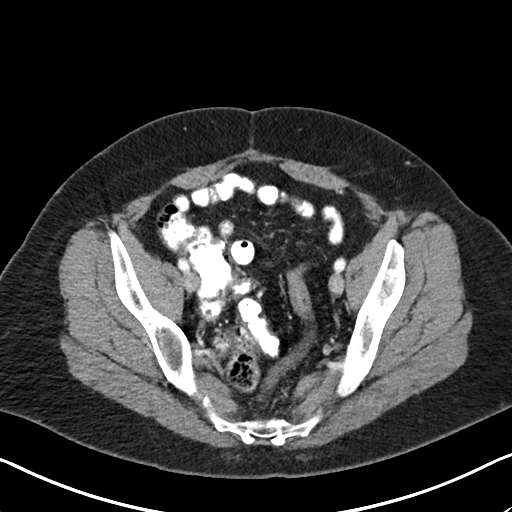
[im 46/98  soft-tissue]
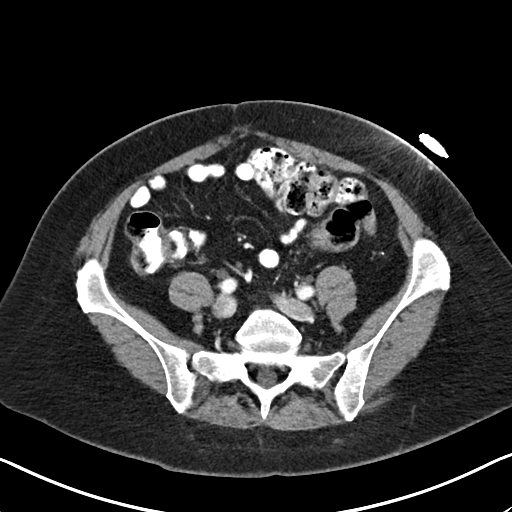
[im 52/98  soft-tissue]
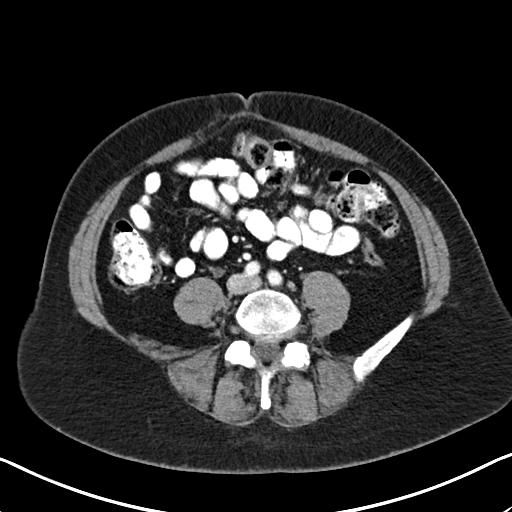
[im 62/98  soft-tissue]
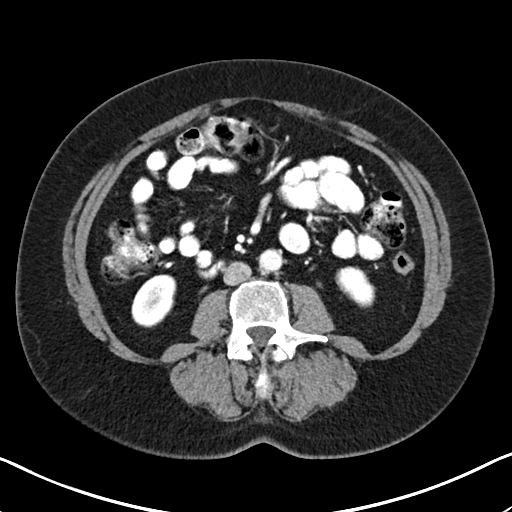
[im 67/98  soft-tissue]
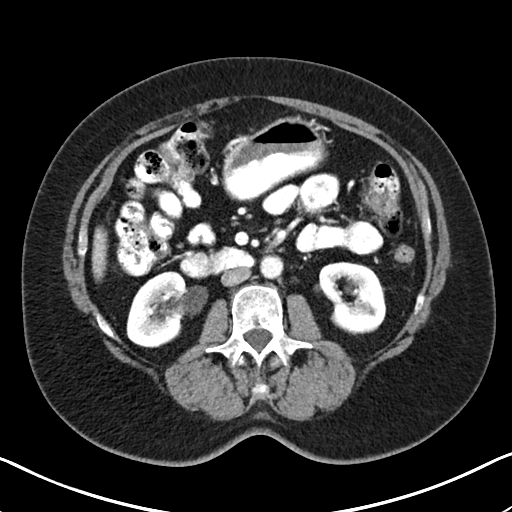
[im 67/98  bone]
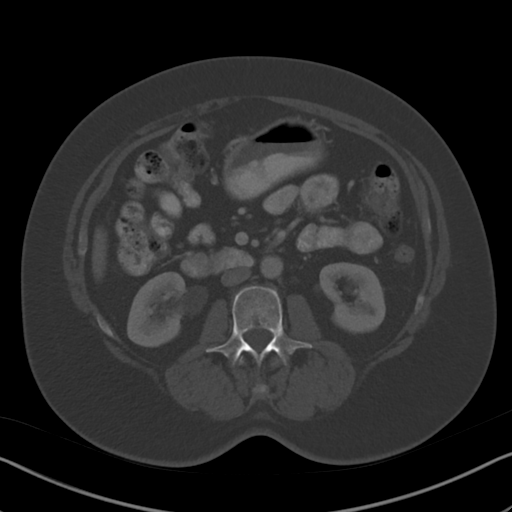
[im 77/98  soft-tissue]
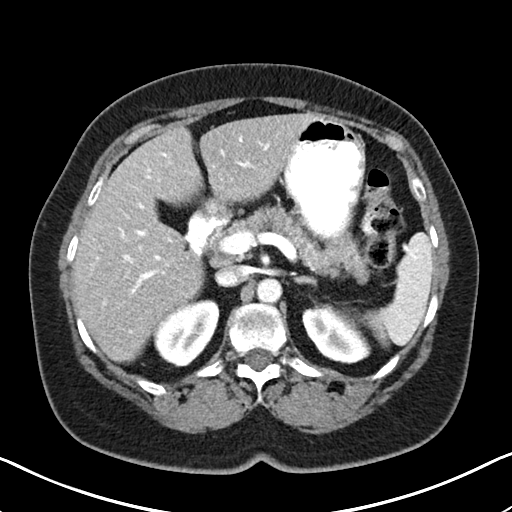
[im 82/98  soft-tissue]
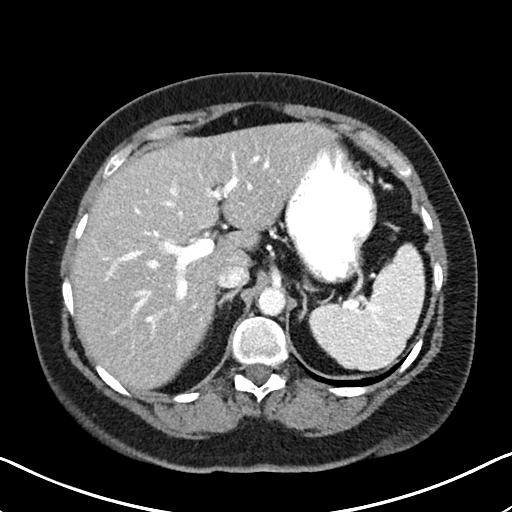
[im 92/98  soft-tissue]
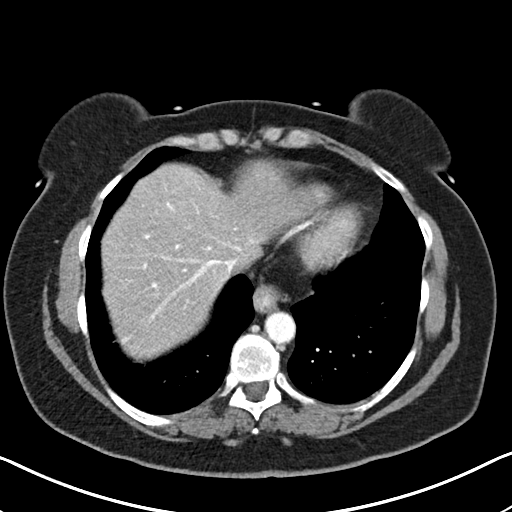

[Series 4: coronals abd pelvis 2.00 cor · coronal · 0.69mm/px · 3 of 148 slices shown]
[im 50/148  soft-tissue]
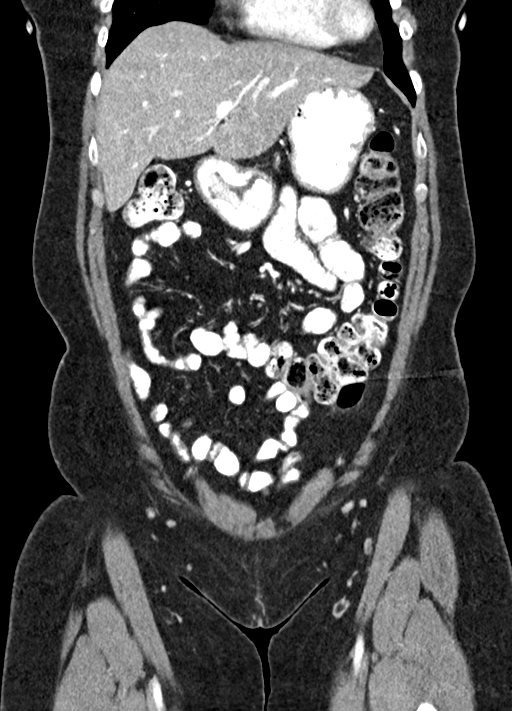
[im 66/148  soft-tissue]
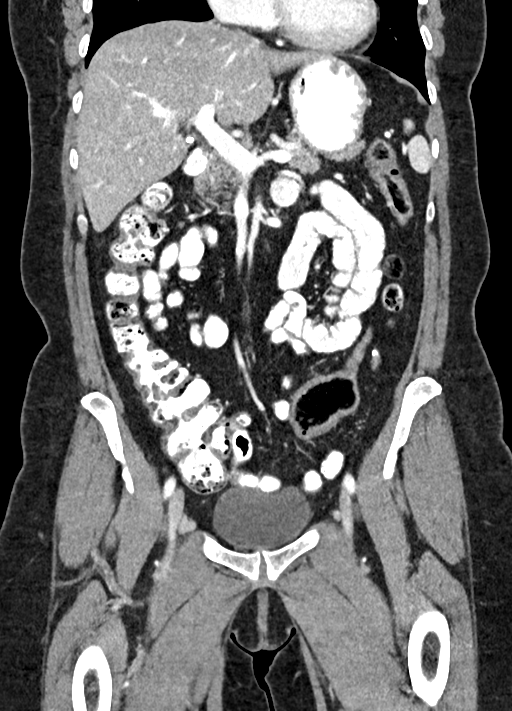
[im 82/148  soft-tissue]
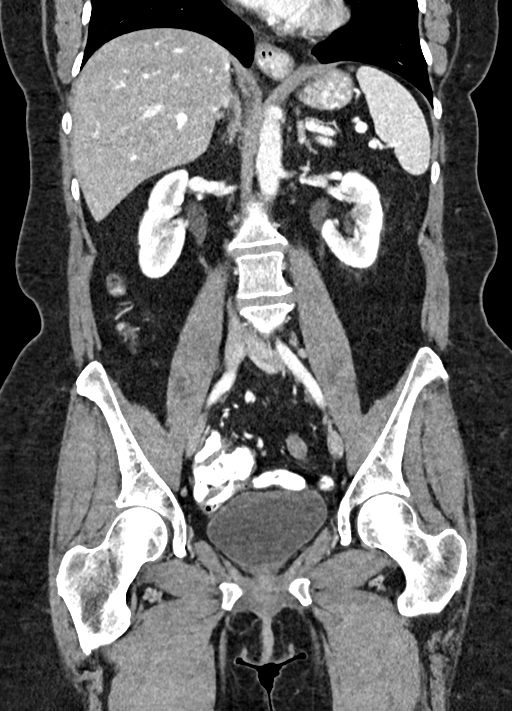

[15 of 46 positions shown; findings below may reference images not displayed]

FINDINGS: Lower chest: Minimal bilateral dependent atelectasis.

Hepatobiliary: No focal liver abnormality is seen. Status post
cholecystectomy. No biliary dilatation.

Pancreas: Unremarkable. No pancreatic ductal dilatation or
surrounding inflammatory changes.

Spleen: Normal in size without focal abnormality.

Adrenals/Urinary Tract: Adrenal glands are unremarkable. Kidneys are
normal, without renal calculi, focal lesion, or hydronephrosis.
Bladder is unremarkable.

Stomach/Bowel: Stomach is within normal limits. Enteric contrast
reaches the splenic flexure. Surgical material is seen in the
sigmoid colon and likely reflects a bowel anastomosis. Portion of
sigmoid colon demonstrates mild circumferential wall thickening
(series 2 images 52-61) which is likely slightly increased since
02/05/2011. There are no significant surrounding inflammatory
changes. No pericecal inflammatory changes are noted to suggest
acute appendicitis. No evidence of bowel obstruction.

Vascular/Lymphatic: Aortic atherosclerosis. No enlarged abdominal or
pelvic lymph nodes.

Reproductive: Status post hysterectomy. No adnexal masses.

Other: No abdominal wall hernia or abnormality. No abdominopelvic
ascites.

Musculoskeletal: No acute or significant osseous findings.
IMPRESSION: 1. Portion of sigmoid colon demonstrates mild circumferential wall
thickening which is likely increased since 02/05/2011. There are no
significant surrounding inflammatory changes, however this may
represent a mild colitis.

Aortic Atherosclerosis ([id number]).

## 2022-05-25 ENCOUNTER — Other Ambulatory Visit: Payer: Self-pay

## 2022-05-25 DIAGNOSIS — Z1231 Encounter for screening mammogram for malignant neoplasm of breast: Secondary | ICD-10-CM

## 2022-06-19 ENCOUNTER — Ambulatory Visit
Admission: RE | Admit: 2022-06-19 | Discharge: 2022-06-19 | Disposition: A | Discharge status: Home / Self Care | Payer: Managed Care, Other (non HMO) | Source: Ambulatory Visit

## 2022-06-19 DIAGNOSIS — Z1231 Encounter for screening mammogram for malignant neoplasm of breast: Secondary | ICD-10-CM | POA: Diagnosis present

## 2023-05-21 ENCOUNTER — Other Ambulatory Visit: Payer: Self-pay

## 2023-05-21 DIAGNOSIS — Z1231 Encounter for screening mammogram for malignant neoplasm of breast: Secondary | ICD-10-CM

## 2023-07-09 ENCOUNTER — Ambulatory Visit
Admission: RE | Admit: 2023-07-09 | Discharge: 2023-07-09 | Disposition: A | Discharge status: Home / Self Care | Payer: Managed Care, Other (non HMO) | Source: Ambulatory Visit

## 2023-07-09 DIAGNOSIS — Z1231 Encounter for screening mammogram for malignant neoplasm of breast: Secondary | ICD-10-CM | POA: Diagnosis present

## 2024-01-15 ENCOUNTER — Other Ambulatory Visit: Payer: Self-pay

## 2024-01-15 DIAGNOSIS — N6321 Unspecified lump in the left breast, upper outer quadrant: Secondary | ICD-10-CM

## 2024-01-23 ENCOUNTER — Ambulatory Visit
Admission: RE | Admit: 2024-01-23 | Discharge: 2024-01-23 | Disposition: A | Discharge status: Home / Self Care | Payer: Self-pay | Source: Ambulatory Visit

## 2024-01-23 DIAGNOSIS — N6321 Unspecified lump in the left breast, upper outer quadrant: Secondary | ICD-10-CM | POA: Diagnosis present

## 2024-01-29 ENCOUNTER — Encounter

## 2024-01-29 ENCOUNTER — Other Ambulatory Visit

## 2024-04-29 NOTE — Progress Notes (Signed)
 [facility]  Chief complaint: Follow-up F/u visit.  Subjective: Rachel  Noble is a 68 y.o. female here for f/u History of Present Illness Rachel  Noble is a 67 year old female who presents with persistent abdominal pain.  She experiences persistent abdominal pain radiating to her shoulder, located at the site of her previous gallbladder removal. She suspects it may be related to scar tissue from her past surgeries, including gallbladder and appendix removal.  She has a history of indigestion and is currently taking omeprazole . She has been attempting to reduce her dosage to every other day but experiences intermittent periods where daily use is necessary. She manages her symptoms with dietary changes, such as eating smaller meals.  Her current medications include omeprazole , levothyroxine, B12, magnesium, omega-3, and prescription vitamin D  weekly. She previously switched from levothyroxine and another medication back to only levothyroxine and is due for blood work to monitor her thyroid  levels.    Current Outpatient Medications:  .  ergocalciferol , vitamin D2, 1,250 mcg (50,000 unit) capsule, Take 1 capsule (50,000 Units total) by mouth once a week, Disp: 12 capsule, Rfl: 3 .  Lactobac no.41/Bifidobact no.7 (PROBIOTIC-10 ORAL), Take 1 tablet by mouth once daily, Disp: , Rfl:  .  levothyroxine (SYNTHROID) 88 MCG tablet, Take 1 tablet (88 mcg total) by mouth once daily Take on an empty stomach with a glass of water at least 30-60 minutes before breakfast., Disp: 90 tablet, Rfl: 3 .  magnesium 200 mg, Take by mouth 2 (two) times daily, Disp: , Rfl:  .  omega-3 acid ethyl esters (LOVAZA) 1 gram capsule, Take 2 g by mouth once daily, Disp: , Rfl:  .  omeprazole  (PRILOSEC) 40 MG DR capsule, Take 1 capsule (40 mg total) by mouth once daily, Disp: 90 capsule, Rfl: 3 .  ubidecarenone (COQ-10 ORAL), Take 1 tablet by mouth once daily, Disp: , Rfl:  .  cyanocobalamin (VITAMIN B12)  1,000 mcg/mL injection, Inject 1 mL (1,000 mcg total) into the muscle monthly (Patient not taking: Reported on 11/21/2023), Disp: 1 mL, Rfl: 11 .  famotidine (PEPCID) 40 MG tablet, Take 1 tablet (40 mg total) by mouth at bedtime (Patient not taking: Reported on 01/22/2023), Disp: 30 tablet, Rfl: 3 .  ferrous sulfate 325 (65 FE) MG tablet, Take 325 mg by mouth daily with breakfast (Patient not taking: Reported on 08/09/2020), Disp: , Rfl:  .  Herbal Supplement, Take 600 mg by mouth 2 (two) times daily Herbal Name: alpha lipoic acid (Patient not taking: Reported on 01/22/2023), Disp: , Rfl:  .  Herbal Supplement, Herbal Name: Optimal-M 2 capsule twice daily (Patient not taking: Reported on 03/05/2023), Disp: , Rfl:  .  magnesium hydroxide (MAGNESIA ORAL), Take 240 mg by mouth once daily (Patient not taking: Reported on 11/21/2023), Disp: , Rfl:  .  polyethylene glycol (MIRALAX) powder, Take as directed for colonic prep. (Patient not taking: Reported on 01/22/2023), Disp: 238 g, Rfl: 0 .  predniSONE  (DELTASONE ) 20 MG tablet, Take 1 tablet (20 mg total) by mouth once daily (Patient not taking: Reported on 02/19/2024), Disp: 7 tablet, Rfl: 0 .  TURMERIC ORAL, Take 1,000 Units by mouth 2 (two) times daily    (Patient not taking: Reported on 02/21/2021), Disp: , Rfl:   ROS reviewed: General ROS:  Negative for  - fatigue, fevers, chills HEENT ROS: negative for seasonal allergies, congestion, cough Respiratory ROS: negative for - cough, SOB, wheezing Cardiovascular ROS: no chest pain or dyspnea on exertion Gastrointestinal ROS: negative  for constipation, N/V,D Genito-Urinary ROS: no dysuria, trouble voiding, or hematuria Musculoskeletal ROS: negative for joint pain, swelling Neurological ROS: negative for headaches, dizzinesss Dermatological ROS: negative for rash or skin lesion    Psychological ROS: negative for depression or anxiety  Allergies  Allergen Reactions  . Lyrica [Pregabalin] Hallucination     States this made her feel loopy.    Social History   Tobacco Use  . Smoking status: Never  . Smokeless tobacco: Never  Vaping Use  . Vaping status: Never Used  Substance Use Topics  . Alcohol use: No    Alcohol/week: 0.0 standard drinks of alcohol  . Drug use: No      Objective:  BP 128/64 (BP Location: Left upper arm, Patient Position: Sitting, BP Cuff Size: Adult)   Pulse 81   Resp 16   Ht 160 cm (5' 2.99)   Wt 70.3 kg (155 lb)   SpO2 96%   BMI 27.47 kg/m  reviewed. Gen: AAOx3. Well-developed and well-nourished. NAD.  HEENT:    HEAD NORMOCEPHALIC.  PERRLA, EOM intact.  No thyromegaly present. No lymphadpathy. Cardiovascular: Normal rate, regular rhythm. Normal S1 and S2 without murmus, rubs or gallops.  Pulmonary/Chest: CTAB. Effort normal and breath sounds normal. No respiratory distress. No wheezes or rales. Abdomen: Soft. Bowel sounds are normal. No distension or tenderness.  Neuro: Cranial nervess II-XII intact.Nonfocal exam. Skin: Skin is warm and dry.  Musculoskeletal: Normal range of motion. No edema, no tenderness.  Psychiatric: Normal mood and affect. Her behavior is normal. Judgment and thought content normal  Assessment and Plan: Assessment & Plan  chest pain Intermittent chest pain possibly due to scar tissue, nerve involvement, or bony issues, potentially related to previous gallbladder surgery. - Keep appointment with [facility] for further evaluation and management.  Gastroesophageal reflux disease (GERD) Chronic GERD managed with omeprazole . She attempted to reduce omeprazole  to every other day to minimize long-term effects on mineral and B12 absorption. - Try omeprazole  every other day if possible. If symptoms worsen, return to daily dosing temporarily and then attempt every other day again. - Keep upcoming appointment with GI specialist to ensure no need for follow-up endoscopy.  Neuropathic pain Neuropathic pain possibly related to previous  surgeries and scar tissue formation. - Keep appointment with [facility] for further evaluation and management.  Hypothyroidism Hypothyroidism well-managed with levothyroxine. Recent blood work shows normal thyroid  levels. - Continue current thyroid  medication regimen.  Right upper quadrant abdominal pain  (primary encounter diagnosis)  Anxiety about health  Hypothyroidism, acquired, unspecified  Compression fracture of thoracic vertebra, sequela       This note has been created using automated tools and reviewed for accuracy by RICHARD LESLIE GILBERT.

## 2024-06-01 ENCOUNTER — Encounter: Payer: Self-pay | Admitting: Family Medicine

## 2024-06-01 ENCOUNTER — Other Ambulatory Visit: Payer: Self-pay | Admitting: Family Medicine

## 2024-06-01 DIAGNOSIS — S22000S Wedge compression fracture of unspecified thoracic vertebra, sequela: Secondary | ICD-10-CM

## 2024-06-04 ENCOUNTER — Ambulatory Visit
Admission: RE | Admit: 2024-06-04 | Discharge: 2024-06-04 | Disposition: A | Discharge status: Home / Self Care | Source: Ambulatory Visit | Attending: Family Medicine | Admitting: Family Medicine

## 2024-06-04 DIAGNOSIS — S22000S Wedge compression fracture of unspecified thoracic vertebra, sequela: Secondary | ICD-10-CM

## 2024-06-08 ENCOUNTER — Other Ambulatory Visit
